# Patient Record
Sex: Female | Born: 1975
Health system: Southern US, Community
[De-identification: ages and names within clinical notes are randomized; demographics above are authoritative.]

## PROBLEM LIST (undated history)

## (undated) DIAGNOSIS — Z8759 Personal history of other complications of pregnancy, childbirth and the puerperium: Secondary | ICD-10-CM

## (undated) DIAGNOSIS — D509 Iron deficiency anemia, unspecified: Secondary | ICD-10-CM

## (undated) DIAGNOSIS — G43909 Migraine, unspecified, not intractable, without status migrainosus: Secondary | ICD-10-CM

## (undated) DIAGNOSIS — D649 Anemia, unspecified: Secondary | ICD-10-CM

## (undated) DIAGNOSIS — N83209 Unspecified ovarian cyst, unspecified side: Secondary | ICD-10-CM

## (undated) HISTORY — DX: Anemia, unspecified: D64.9

## (undated) HISTORY — DX: Migraine, unspecified, not intractable, without status migrainosus: G43.909

## (undated) HISTORY — DX: Personal history of other complications of pregnancy, childbirth and the puerperium: Z87.59

---

## 2013-04-17 ENCOUNTER — Other Ambulatory Visit (HOSPITAL_COMMUNITY): Payer: Self-pay | Admitting: Family Medicine

## 2013-04-17 DIAGNOSIS — R0989 Other specified symptoms and signs involving the circulatory and respiratory systems: Secondary | ICD-10-CM

## 2013-04-17 DIAGNOSIS — E049 Nontoxic goiter, unspecified: Secondary | ICD-10-CM

## 2013-04-17 DIAGNOSIS — H9319 Tinnitus, unspecified ear: Secondary | ICD-10-CM

## 2013-04-23 ENCOUNTER — Ambulatory Visit (HOSPITAL_COMMUNITY): Payer: 59

## 2013-04-30 ENCOUNTER — Ambulatory Visit (HOSPITAL_COMMUNITY)
Admission: RE | Admit: 2013-04-30 | Discharge: 2013-04-30 | Disposition: A | Discharge status: Home / Self Care | Payer: 59 | Source: Ambulatory Visit | Attending: Family Medicine | Admitting: Family Medicine

## 2013-04-30 DIAGNOSIS — R599 Enlarged lymph nodes, unspecified: Secondary | ICD-10-CM | POA: Insufficient documentation

## 2013-04-30 DIAGNOSIS — E049 Nontoxic goiter, unspecified: Secondary | ICD-10-CM

## 2013-05-05 ENCOUNTER — Ambulatory Visit (HOSPITAL_COMMUNITY): Payer: 59

## 2013-06-02 ENCOUNTER — Other Ambulatory Visit (HOSPITAL_COMMUNITY): Payer: 59

## 2014-10-13 IMAGING — US US SOFT TISSUE HEAD/NECK
1 series · 14 of 25 positions shown · non-contrast
Comparison: None.

CLINICAL DATA: Border

EXAM:
THYROID ULTRASOUND
TECHNIQUE: Ultrasound examination of the thyroid gland and adjacent soft
tissues was performed.

[Series 1: us soft tissue head/neck · 0.07mm/px · 14 of 26 slices shown]
[im 1/26]
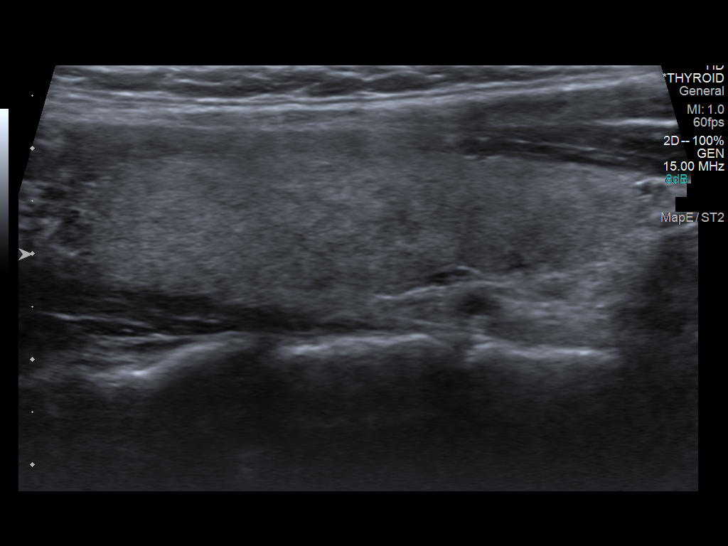
[im 3/26]
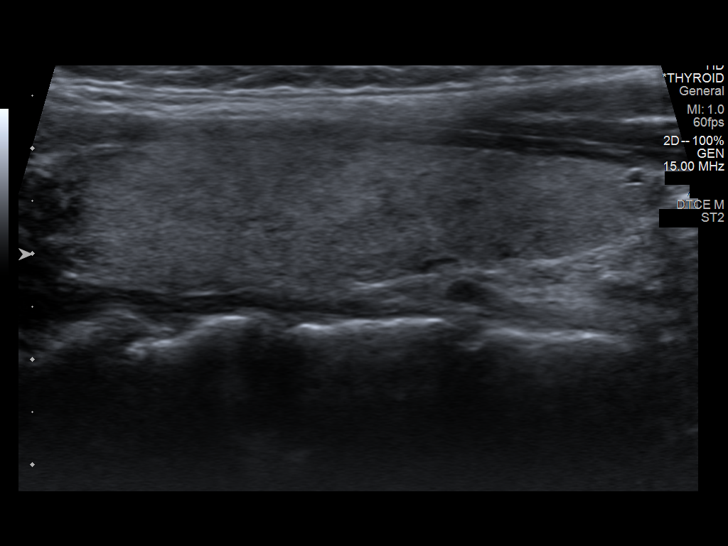
[im 5/26]
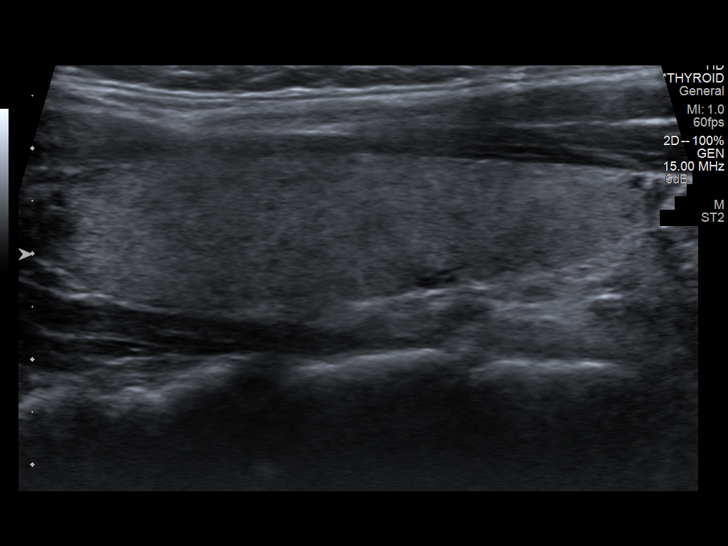
[im 7/26]
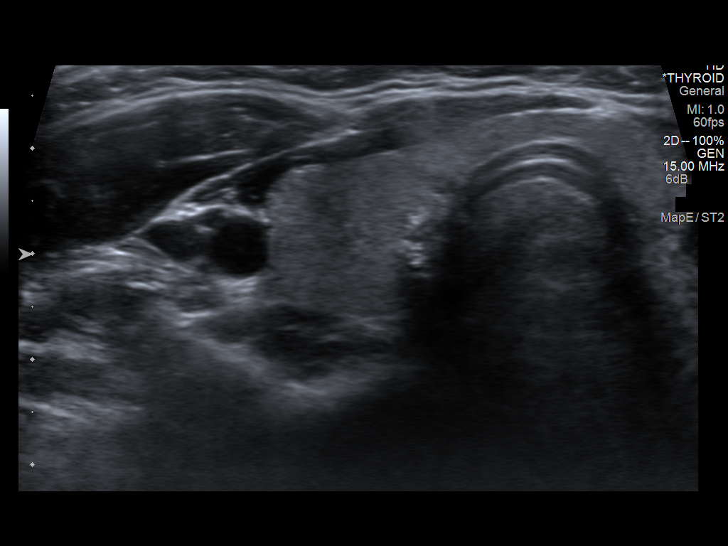
[im 9/26]
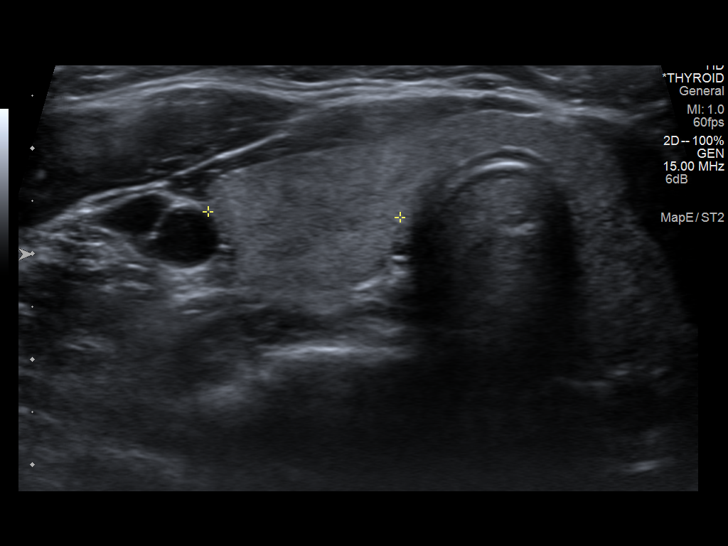
[im 10/26]
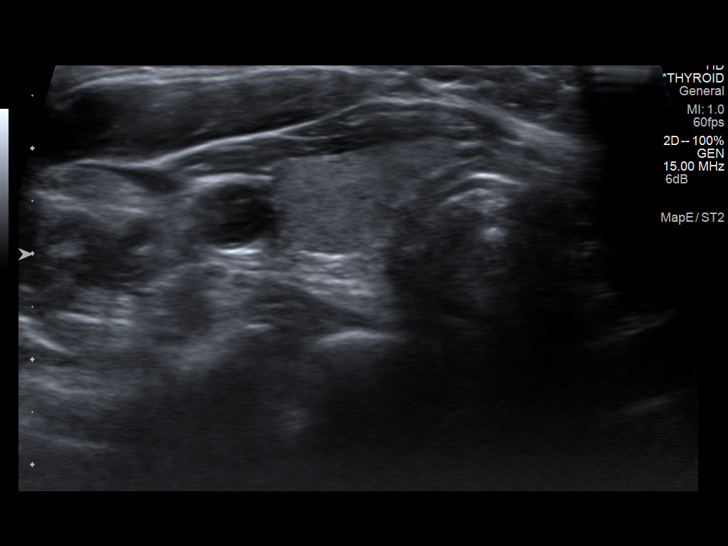
[im 12/26]
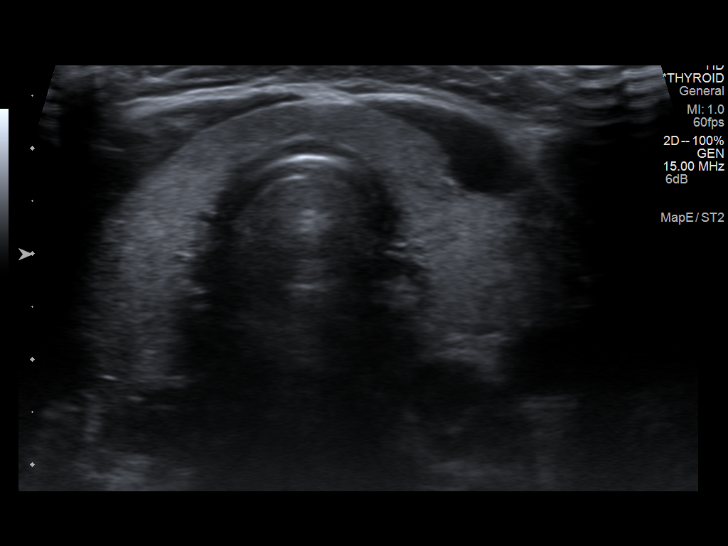
[im 14/26]
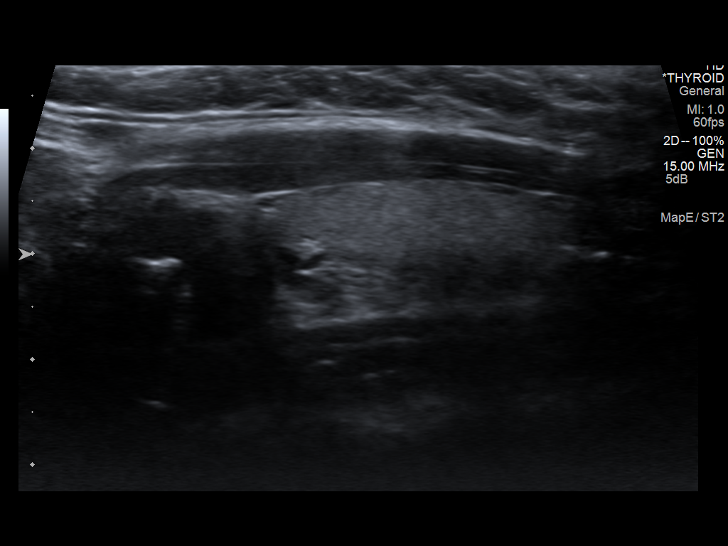
[im 16/26]
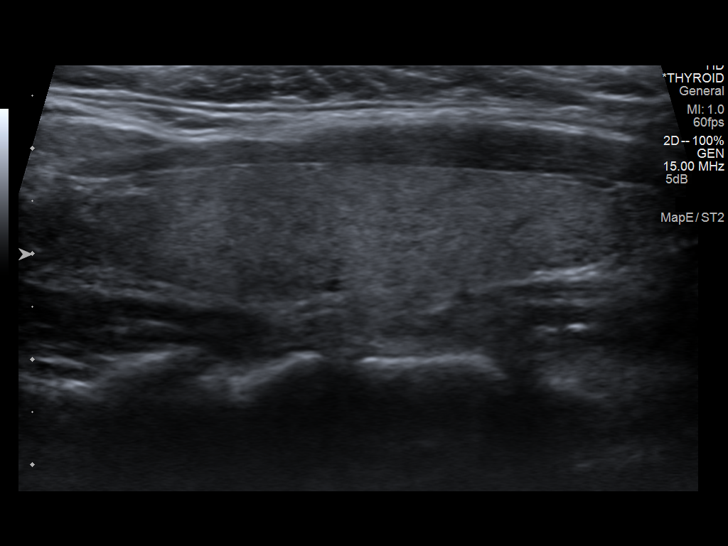
[im 17/26]
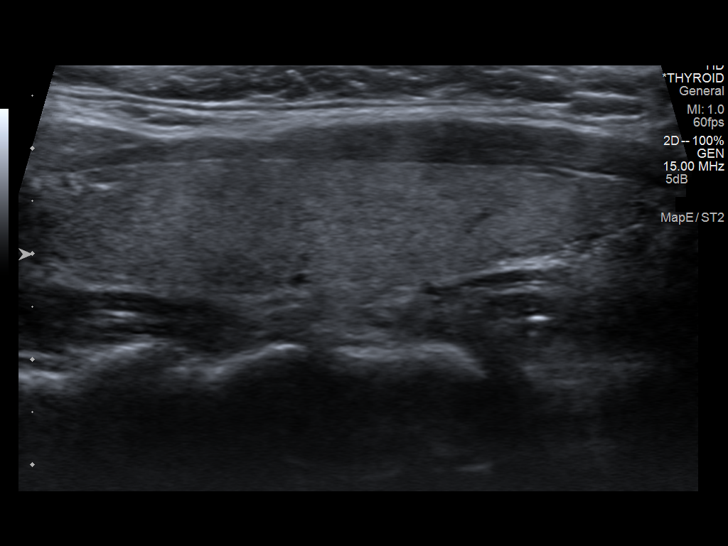
[im 19/26]
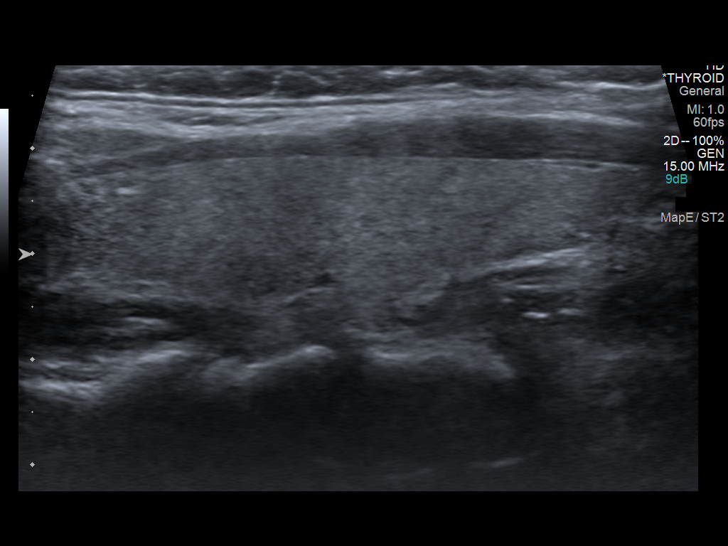
[im 21/26]
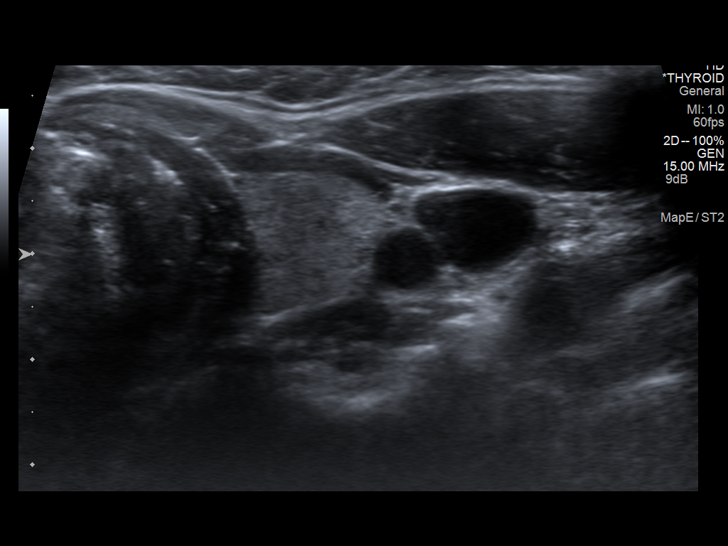
[im 23/26]
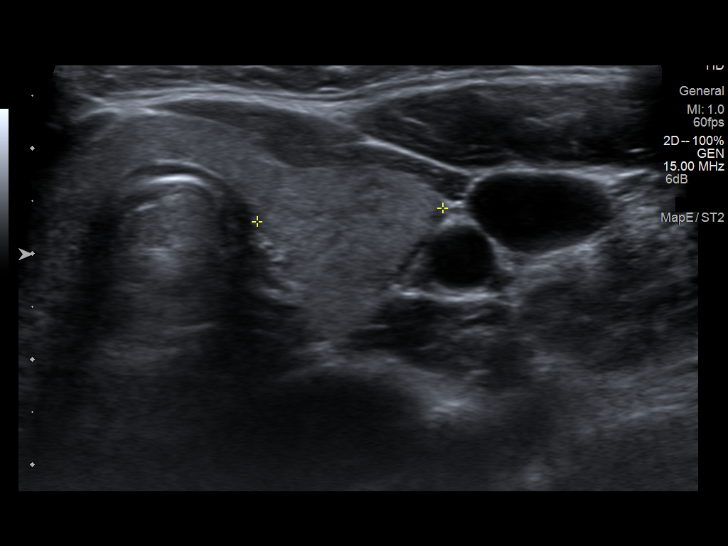
[im 26/26]
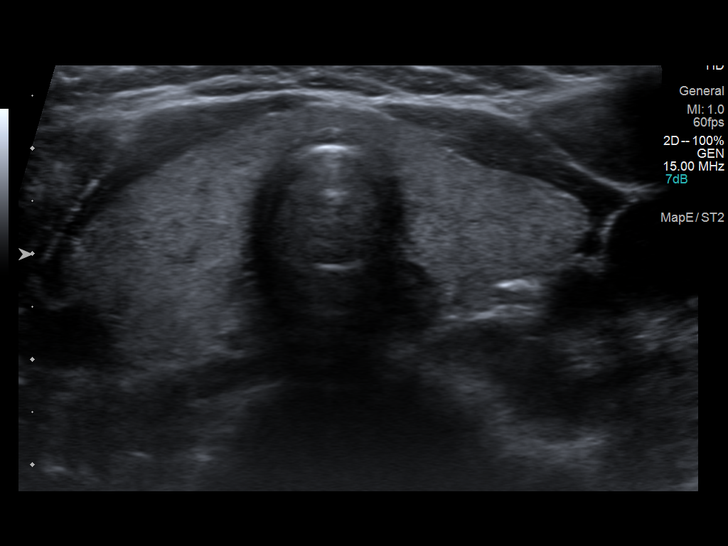

[14 of 25 positions shown; findings below may reference images not displayed]

FINDINGS: Right thyroid lobe

Measurements: 5.6 x 1.4 x 1.8 cm.  No nodules visualized.

Left thyroid lobe

Measurements: 6.0 x 1.4 x 1.8 cm from.  No nodules visualized.

Isthmus

Thickness: 0.4 cm.  No nodules visualized.

Lymphadenopathy

None visualized.
IMPRESSION: The thyroid gland is enlarged and demonstrates a fairly homogeneous
echotexture. No nodules are demonstrated.

## 2015-08-25 ENCOUNTER — Other Ambulatory Visit: Payer: Self-pay | Admitting: Pediatrics

## 2015-08-25 MED ORDER — ATOVAQUONE-PROGUANIL HCL 250-100 MG PO TABS: 1.0000 | ORAL_TABLET | Freq: Every day | ORAL | Status: DC

## 2015-08-25 MED ORDER — TYPHOID VACCINE PO CPDR: 1.0000 | DELAYED_RELEASE_CAPSULE | ORAL | Status: DC

## 2015-08-31 MED FILL — ATOVAQUONE-PROGUANIL 250-10: 250-100 | 16 days supply | Qty: 16 | Fill #0

## 2015-08-31 MED FILL — VIVOTIF EC CAPSULE: 7 days supply | Qty: 4 | Fill #0

## 2016-03-14 ENCOUNTER — Ambulatory Visit (INDEPENDENT_AMBULATORY_CARE_PROVIDER_SITE_OTHER): Payer: 59 | Admitting: Family Medicine

## 2016-03-14 VITALS — BP 114/78 | HR 70 | Temp 98.1°F | Resp 17 | Ht 65.0 in | Wt 142.0 lb

## 2016-03-14 DIAGNOSIS — R011 Cardiac murmur, unspecified: Secondary | ICD-10-CM

## 2016-03-14 DIAGNOSIS — D500 Iron deficiency anemia secondary to blood loss (chronic): Secondary | ICD-10-CM

## 2016-03-14 DIAGNOSIS — G43809 Other migraine, not intractable, without status migrainosus: Secondary | ICD-10-CM

## 2016-03-14 MED ORDER — NORETHINDRONE ACET-ETHINYL EST 1-20 MG-MCG PO TABS: 1.0000 | ORAL_TABLET | Freq: Every day | ORAL | 4 refills | Status: DC

## 2016-03-14 MED ORDER — SUMATRIPTAN SUCCINATE 100 MG PO TABS: 50.0000 mg | ORAL_TABLET | ORAL | 2 refills | Status: DC | PRN

## 2016-03-14 MED FILL — SUMATRIPTAN SUCC 100 MG TAB: 100 | 30 days supply | Qty: 9 | Fill #0

## 2016-03-14 MED FILL — LARIN 21 1-20 TABLET: 1-20 | 84 days supply | Qty: 84 | Fill #0

## 2016-03-14 NOTE — Progress Notes (Signed)
Subjective:  By signing my name below, I, Essence Howell, attest that this documentation has been prepared under the direction and in the presence of Norberto SorensonEva Shaw, MD Electronically Signed: Charline BillsEssence Howell, ED Scribe 03/14/2016 at 3:43 PM.   Patient ID: Heather Cole Moulin, female    DOB: 03-18-1976, 40 y.o.   MRN: [id number]  Chief Complaint  Patient presents with  . Contraception    and establish care    HPI HPI Comments: Heather Cole Gerst is a 40 y.o. female who presents to the [facility] to establish care and start contraception due to heavy menstrual periods. Pt has not tried any hormonal contraceptions in the past and has tried iron supplements. She states that her hemoglobin is usually around 12 but she has noticed pulsatile symptoms in her ears when her menstrual periods starts. She reports moderate cramping and heavy bleeding over the first 2 day of her menstrual period that typically last for 5 days. No prior pregnancy and no abnormal pap smears although she reports that she has not had 1 in a while. 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 No family h/o HTN, DM or high cholesterol.   Migraines H/o migraines without aura, not menstrual related that seems to be brought about with lack of sleep. She treats migraines with Imitrex but states that she ran out. Pt requests a refill at this visit.   Immunizations Pt received her flu vaccine this year and her tetanus in December 2008.   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  No past medical history on file.  No current outpatient prescriptions on file prior to visit.   No current facility-administered medications on file prior to visit.    Not on File  Review of Systems    Objective:   Physical Exam  Constitutional: She is oriented to person, place, and time. She appears well-developed and well-nourished. No distress.  HENT:  Head:  Normocephalic and atraumatic.  Eyes: Conjunctivae and EOM are normal.  Neck: Neck supple. No tracheal deviation present.  Cardiovascular: Normal rate and regular rhythm.   Murmur heard. Pulmonary/Chest: Effort normal and breath sounds normal. No respiratory distress.  Musculoskeletal: Normal range of motion.  Neurological: She is alert and oriented to person, place, and time.  Skin: Skin is warm and dry.  Psychiatric: She has a normal mood and affect. Her behavior is normal.  Nursing note and vitals reviewed.  BP 114/78 (BP Location: Right Arm, Patient Position: Sitting, Cuff Size: Normal)   Pulse 70   Temp 98.1 F (36.7 C) (Oral)   Resp 17   Ht 5\' 5"  (1.651 m)   Wt 142 lb (64.4 kg)   LMP 03/08/2016 (Approximate)   SpO2 100%   BMI 23.63 kg/m     Assessment & Plan:   1. Iron deficiency anemia due to chronic blood loss - Cont prn iron , checks hgb occ at work. Start trial of OCP to reduce menorrhagia. Check BP at work.  2. Heart murmur - physiologic, suspect benign, recheck at f/u.  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 Migraines well controlled. Pt reports due for pap and will sched, no h/o abnml.  Meds ordered this encounter  Medications  . norethindrone-ethinyl estradiol (LOESTRIN 1/20, 21,) 1-20 MG-MCG tablet    Sig: Take 1 tablet by mouth daily.    Dispense:  4 Package    Refill:  4  Recommend skipping placebo pills so will need 4 packs every 3 mos  . SUMAtriptan (IMITREX) 100 MG tablet    Sig: Take 0.5 tablets (50 mg total) by mouth every 2 (two) hours as needed for migraine. May repeat in 2 hours if headache persists or recurs.    Dispense:  10 tablet    Refill:  2    I personally performed the services described in this documentation, which was scribed in my presence. The recorded information has been reviewed and considered, and addended by me as needed.   Norberto SorensonEva Shaw, M.D.  [facility]  [place] [address] [place], [place]  [address] [phone] phone [phone] fax  03/27/16 10:33 PM

## 2016-03-14 NOTE — Patient Instructions (Addendum)
     IF you received an x-ray today, you will receive an invoice from [facility]. Please contact [facility] at [phone] with questions or concerns regarding your invoice.   IF you received labwork today, you will receive an invoice from [facility]. Please contact [facility] at [phone] with questions or concerns regarding your invoice.   Our billing staff will not be able to assist you with questions regarding bills from these companies.  You will be contacted with the lab results as soon as they are available. The fastest way to get your results is to activate your My Chart account. Instructions are located on the last page of this paperwork. If you have not heard from us regarding the results in 2 weeks, please contact this office.     

## 2016-03-27 ENCOUNTER — Telehealth: Payer: 59 | Admitting: Nurse Practitioner

## 2016-03-27 DIAGNOSIS — N3000 Acute cystitis without hematuria: Secondary | ICD-10-CM

## 2016-03-27 MED ORDER — NITROFURANTOIN MONOHYD MACRO 100 MG PO CAPS: 100.0000 mg | ORAL_CAPSULE | Freq: Two times a day (BID) | ORAL | 0 refills | Status: DC

## 2016-03-27 NOTE — Progress Notes (Signed)

## 2016-03-30 ENCOUNTER — Encounter: Payer: Self-pay | Admitting: Family Medicine

## 2016-04-09 DIAGNOSIS — D500 Iron deficiency anemia secondary to blood loss (chronic): Secondary | ICD-10-CM | POA: Insufficient documentation

## 2016-04-09 DIAGNOSIS — N920 Excessive and frequent menstruation with regular cycle: Secondary | ICD-10-CM | POA: Insufficient documentation

## 2016-04-09 DIAGNOSIS — G43009 Migraine without aura, not intractable, without status migrainosus: Secondary | ICD-10-CM | POA: Insufficient documentation

## 2016-04-09 DIAGNOSIS — Z91030 Bee allergy status: Secondary | ICD-10-CM | POA: Insufficient documentation

## 2016-04-09 NOTE — Progress Notes (Signed)
Subjective:    Patient ID: Heather Cole, female    DOB: 16-Feb-1976, 41 y.o.   MRN: [id number] Chief Complaint  Patient presents with  . Annual Exam    pap    HPI  Heather Cole is a delightful 41 yo woman here today for a complete physical.   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  Primary Preventative Screenings: Cervical Cancer: No h/o abnml paps, last has been a while. STI screening: Breast Cancer: Colorectal Cancer: Lung Cancer: Bone Density: Cardiac: Weight/blood sugar: OTC/vit/supp/herbal: Diet/Exercise: Dentist/Optho: Immunizations: tetanus 02/2007, flu annually at work  Chronic Medical Conditions: Migraines: prn imitrex   Menorrhagia: monitors hgb occ and uses iron supp prn. Can tell when anemic by tinnitus type pulse sound. Started pt on OCPs 1 mo prior. Placed on lowest dose of LoEstrin but has had breakthrough bleeding. hgb 12 last week so added more meat in her diet last week - takes iron prn 4-5 days/wk.  Anaphylaxis to bees - keeps epi-pen.   Depression screen PHQ 2/9 04/10/2016  Decreased Interest 0  Down, Depressed, Hopeless 0  PHQ - 2 Score 0   Past Medical History:  Diagnosis Date  . Anemia    No past surgical history on file. Current Outpatient Prescriptions on File Prior to Visit  Medication Sig Dispense Refill  . SUMAtriptan (IMITREX) 100 MG tablet Take 0.5 tablets (50 mg total) by mouth every 2 (two) hours as needed for migraine. May repeat in 2 hours if headache persists or recurs. 10 tablet 2  . nitrofurantoin, macrocrystal-monohydrate, (MACROBID) 100 MG capsule Take 1 capsule (100 mg total) by mouth 2 (two) times daily. 1 po BId (Patient not taking: Reported on 04/10/2016) 14 capsule 0   No current facility-administered medications on file prior to visit.    Not on File No family history on file. Social History   Social History  . Marital status: Single    Spouse name: N/A  . Number of children: N/A  . Years of  education: N/A   Social History Main Topics  . Smoking status: Never Smoker  . Smokeless tobacco: None  . Alcohol use None  . Drug use: Unknown  . Sexual activity: Not Asked   Other Topics Concern  . None   Social History Narrative  . None    Review of Systems See hpi    Objective:   Physical Exam  Constitutional: She is oriented to person, place, and time. She appears well-developed and well-nourished. No distress.  HENT:  Head: Normocephalic and atraumatic.  Right Ear: Tympanic membrane, external ear and ear canal normal.  Left Ear: Tympanic membrane, external ear and ear canal normal.  Nose: Mucosal edema present. No rhinorrhea.  Mouth/Throat: Uvula is midline, oropharynx is clear and moist and mucous membranes are normal. No posterior oropharyngeal erythema.  Eyes: Conjunctivae and EOM are normal. Pupils are equal, round, and reactive to light. Right eye exhibits no discharge. Left eye exhibits no discharge. No scleral icterus.  Neck: Normal range of motion. Neck supple. No thyromegaly present.  Cardiovascular: Normal rate, regular rhythm, normal heart sounds and intact distal pulses.   Pulmonary/Chest: Effort normal and breath sounds normal. No respiratory distress.  Abdominal: Soft. Bowel sounds are normal. There is no tenderness.  Genitourinary: Vagina normal and uterus normal. No breast swelling, tenderness, discharge or bleeding. Cervix exhibits no motion tenderness and no friability. Right adnexum displays no mass and no tenderness. Left adnexum displays no mass and no tenderness.  Musculoskeletal:  She exhibits no edema.  Lymphadenopathy:    She has no cervical adenopathy.  Neurological: She is alert and oriented to person, place, and time. She has normal reflexes.  Skin: Skin is warm and dry. She is not diaphoretic. No erythema.  Psychiatric: She has a normal mood and affect. Her behavior is normal.     BP 126/79   Pulse 61   Temp 97.9 F (36.6 C) (Oral)   Resp  16   Ht 5\' 5"  (1.651 m)   Wt 145 lb (65.8 kg)   LMP 03/02/2016   SpO2 100%   BMI 24.13 kg/m   Assessment & Plan:  Ok to provide another years refill of OCPs if pt desires at the end of the year as long as she is continueing to monitor her blood pressure and hemoglobin at her office. 1. Annual physical exam   2. Routine screening for STI (sexually transmitted infection)   3. Screening for cardiovascular, respiratory, and genitourinary diseases   4. Screening for cervical cancer   5. Screening for thyroid disorder   6. Menorrhagia with regular cycle   7. Iron deficiency anemia due to chronic blood loss   8. Migraine without aura and without status migrainosus, not intractable   9. History of systemic reaction to bee sting   10. Macrocytosis   Step dose of OCPs up as she has had breakthrough bleeding  Orders Placed This Encounter  Procedures  . Tdap vaccine greater than or equal to 7yo IM  . CBC  . Comprehensive metabolic panel    Order Specific Question:   Has the patient fasted?    Answer:   Yes  . TSH  . Lipid panel    Order Specific Question:   Has the patient fasted?    Answer:   Yes  . HIV antibody  . Ferritin  . B12 and Folate Panel  . B12 and Folate Panel  . Specimen status report  . POCT urinalysis dipstick    Meds ordered this encounter  Medications  . norethindrone-ethinyl estradiol 1/35 (ORTHO-NOVUM 1/35, 28,) tablet    Sig: Take 1 tablet by mouth daily. Skipping placebo pills    Dispense:  4 Package    Refill:  4    Recommend skipping placebo pills so will need 4 packs every 3 months     Norberto SorensonEva Shaw, M.D.  [facility]  [place] [address] [place], [place] [address] [phone] phone [phone] fax  05/07/16 1:14 AM

## 2016-04-10 ENCOUNTER — Ambulatory Visit (INDEPENDENT_AMBULATORY_CARE_PROVIDER_SITE_OTHER): Payer: 59 | Admitting: Family Medicine

## 2016-04-10 ENCOUNTER — Encounter: Payer: Self-pay | Admitting: Family Medicine

## 2016-04-10 VITALS — BP 126/79 | HR 61 | Temp 97.9°F | Resp 16 | Ht 65.0 in | Wt 145.0 lb

## 2016-04-10 DIAGNOSIS — Z113 Encounter for screening for infections with a predominantly sexual mode of transmission: Secondary | ICD-10-CM | POA: Diagnosis not present

## 2016-04-10 DIAGNOSIS — Z1383 Encounter for screening for respiratory disorder NEC: Secondary | ICD-10-CM

## 2016-04-10 DIAGNOSIS — Z1329 Encounter for screening for other suspected endocrine disorder: Secondary | ICD-10-CM | POA: Diagnosis not present

## 2016-04-10 DIAGNOSIS — D7589 Other specified diseases of blood and blood-forming organs: Secondary | ICD-10-CM | POA: Diagnosis not present

## 2016-04-10 DIAGNOSIS — Z23 Encounter for immunization: Secondary | ICD-10-CM | POA: Diagnosis not present

## 2016-04-10 DIAGNOSIS — Z1389 Encounter for screening for other disorder: Secondary | ICD-10-CM

## 2016-04-10 DIAGNOSIS — Z136 Encounter for screening for cardiovascular disorders: Secondary | ICD-10-CM

## 2016-04-10 DIAGNOSIS — Z124 Encounter for screening for malignant neoplasm of cervix: Secondary | ICD-10-CM | POA: Diagnosis not present

## 2016-04-10 DIAGNOSIS — G43009 Migraine without aura, not intractable, without status migrainosus: Secondary | ICD-10-CM | POA: Diagnosis not present

## 2016-04-10 DIAGNOSIS — Z91030 Bee allergy status: Secondary | ICD-10-CM | POA: Diagnosis not present

## 2016-04-10 DIAGNOSIS — N920 Excessive and frequent menstruation with regular cycle: Secondary | ICD-10-CM | POA: Diagnosis not present

## 2016-04-10 DIAGNOSIS — Z0000 Encounter for general adult medical examination without abnormal findings: Secondary | ICD-10-CM

## 2016-04-10 DIAGNOSIS — D500 Iron deficiency anemia secondary to blood loss (chronic): Secondary | ICD-10-CM | POA: Diagnosis not present

## 2016-04-10 LAB — POCT URINALYSIS DIP (MANUAL ENTRY)
Bilirubin, UA: NEGATIVE
Blood, UA: NEGATIVE
Glucose, UA: NEGATIVE
Ketones, POC UA: NEGATIVE
Leukocytes, UA: NEGATIVE
Nitrite, UA: NEGATIVE
Protein Ur, POC: NEGATIVE
Spec Grav, UA: 1.01
Urobilinogen, UA: 0.2
pH, UA: 7

## 2016-04-10 MED ORDER — NORETHINDRONE-ETH ESTRADIOL 1-35 MG-MCG PO TABS: 1.0000 | ORAL_TABLET | Freq: Every day | ORAL | 4 refills | Status: DC

## 2016-04-10 MED FILL — DASETTA 1-35-28 TABLET: 1-35 | 84 days supply | Qty: 112 | Fill #0

## 2016-04-10 NOTE — Patient Instructions (Addendum)
If you ever need anything and I am not being good about MyChart response, never hesitate to text me at [phone].    IF you received an x-ray today, you will receive an invoice from [facility]. Please contact [facility] at [phone] with questions or concerns regarding your invoice.   IF you received labwork today, you will receive an invoice from [facility]. Please contact [facility] at [phone] with questions or concerns regarding your invoice.   Our billing staff will not be able to assist you with questions regarding bills from these companies.  You will be contacted with the lab results as soon as they are available. The fastest way to get your results is to activate your My Chart account. Instructions are located on the last page of this paperwork. If you have not heard from [place] regarding the results in 2 weeks, please contact this office.     Health Maintenance, Female Introduction Adopting a healthy lifestyle and getting preventive care can go a long way to promote health and wellness. Talk with your health care provider about what schedule of regular examinations is right for you. This is a good chance for you to check in with your provider about disease prevention and staying healthy. In between checkups, there are plenty of things you can do on your own. Experts have done a lot of research about which lifestyle changes and preventive measures are most likely to keep you healthy. Ask your health care provider for more information. Weight and diet Eat a healthy diet  Be sure to include plenty of vegetables, fruits, low-fat dairy products, and lean protein.  Do not eat a lot of foods high in solid fats, added sugars, or salt.  Get regular exercise. This is one of the most important things you can do for your health.  Most adults should exercise for at least 150 minutes each week. The exercise should increase your heart rate and make you sweat  (moderate-intensity exercise).  Most adults should also do strengthening exercises at least twice a week. This is in addition to the moderate-intensity exercise. Maintain a healthy weight  Body mass index (BMI) is a measurement that can be used to identify possible weight problems. It estimates body fat based on height and weight. Your health care provider can help determine your BMI and help you achieve or maintain a healthy weight.  For females 69 years of age and older:  A BMI below 18.5 is considered underweight.  A BMI of 18.5 to 24.9 is normal.  A BMI of 25 to 29.9 is considered overweight.  A BMI of 30 and above is considered obese. Watch levels of cholesterol and blood lipids  You should start having your blood tested for lipids and cholesterol at 39 years of age, then have this test every 5 years.  You may need to have your cholesterol levels checked more often if:  Your lipid or cholesterol levels are high.  You are older than 39 years of age.  You are at high risk for heart disease. Cancer screening Lung Cancer  Lung cancer screening is recommended for adults 63-81 years old who are at high risk for lung cancer because of a history of smoking.  A yearly low-dose CT scan of the lungs is recommended for people who:  Currently smoke.  Have quit within the past 15 years.  Have at least a 30-pack-year history of smoking. A pack year is smoking an average of one pack of cigarettes a day  for 1 year.  Yearly screening should continue until it has been 15 years since you quit.  Yearly screening should stop if you develop a health problem that would prevent you from having lung cancer treatment. Breast Cancer  Practice breast self-awareness. This means understanding how your breasts normally appear and feel.  It also means doing regular breast self-exams. Let your health care provider know about any changes, no matter how small.  If you are in your 20s or 30s, you  should have a clinical breast exam (CBE) by a health care provider every 1-3 years as part of a regular health exam.  If you are 22 or older, have a CBE every year. Also consider having a breast X-ray (mammogram) every year.  If you have a family history of breast cancer, talk to your health care provider about genetic screening.  If you are at high risk for breast cancer, talk to your health care provider about having an MRI and a mammogram every year.  Breast cancer gene (BRCA) assessment is recommended for women who have family members with BRCA-related cancers. BRCA-related cancers include:  Breast.  Ovarian.  Tubal.  Peritoneal cancers.  Results of the assessment will determine the need for genetic counseling and BRCA1 and BRCA2 testing. Cervical Cancer  Your health care provider may recommend that you be screened regularly for cancer of the pelvic organs (ovaries, uterus, and vagina). This screening involves a pelvic examination, including checking for microscopic changes to the surface of your cervix (Pap test). You may be encouraged to have this screening done every 3 years, beginning at age 33.  For women ages 84-65, health care providers may recommend pelvic exams and Pap testing every 3 years, or they may recommend the Pap and pelvic exam, combined with testing for human papilloma virus (HPV), every 5 years. Some types of HPV increase your risk of cervical cancer. Testing for HPV may also be done on women of any age with unclear Pap test results.  Other health care providers may not recommend any screening for nonpregnant women who are considered low risk for pelvic cancer and who do not have symptoms. Ask your health care provider if a screening pelvic exam is right for you.  If you have had past treatment for cervical cancer or a condition that could lead to cancer, you need Pap tests and screening for cancer for at least 20 years after your treatment. If Pap tests have been  discontinued, your risk factors (such as having a new sexual partner) need to be reassessed to determine if screening should resume. Some women have medical problems that increase the chance of getting cervical cancer. In these cases, your health care provider may recommend more frequent screening and Pap tests. Colorectal Cancer  This type of cancer can be detected and often prevented.  Routine colorectal cancer screening usually begins at 39 years of age and continues through 39 years of age.  Your health care provider may recommend screening at an earlier age if you have risk factors for colon cancer.  Your health care provider may also recommend using home test kits to check for hidden blood in the stool.  A small camera at the end of a tube can be used to examine your colon directly (sigmoidoscopy or colonoscopy). This is done to check for the earliest forms of colorectal cancer.  Routine screening usually begins at age 29.  Direct examination of the colon should be repeated every 5-10 years through 39 years  of age. However, you may need to be screened more often if early forms of precancerous polyps or small growths are found. Skin Cancer  Check your skin from head to toe regularly.  Tell your health care provider about any new moles or changes in moles, especially if there is a change in a mole's shape or color.  Also tell your health care provider if you have a mole that is larger than the size of a pencil eraser.  Always use sunscreen. Apply sunscreen liberally and repeatedly throughout the day.  Protect yourself by wearing long sleeves, pants, a wide-brimmed hat, and sunglasses whenever you are outside. Heart disease, diabetes, and high blood pressure  High blood pressure causes heart disease and increases the risk of stroke. High blood pressure is more likely to develop in:  People who have blood pressure in the high end of the normal range (130-139/85-89 mm Hg).  People  who are overweight or obese.  People who are African American.  If you are 62-43 years of age, have your blood pressure checked every 3-5 years. If you are 84 years of age or older, have your blood pressure checked every year. You should have your blood pressure measured twice-once when you are at a hospital or clinic, and once when you are not at a hospital or clinic. Record the average of the two measurements. To check your blood pressure when you are not at a hospital or clinic, you can use:  An automated blood pressure machine at a pharmacy.  A home blood pressure monitor.  If you are between 42 years and 95 years old, ask your health care provider if you should take aspirin to prevent strokes.  Have regular diabetes screenings. This involves taking a blood sample to check your fasting blood sugar level.  If you are at a normal weight and have a low risk for diabetes, have this test once every three years after 39 years of age.  If you are overweight and have a high risk for diabetes, consider being tested at a younger age or more often. Preventing infection Hepatitis B  If you have a higher risk for hepatitis B, you should be screened for this virus. You are considered at high risk for hepatitis B if:  You were born in a country where hepatitis B is common. Ask your health care provider which countries are considered high risk.  Your parents were born in a high-risk country, and you have not been immunized against hepatitis B (hepatitis B vaccine).  You have HIV or AIDS.  You use needles to inject street drugs.  You live with someone who has hepatitis B.  You have had sex with someone who has hepatitis B.  You get hemodialysis treatment.  You take certain medicines for conditions, including cancer, organ transplantation, and autoimmune conditions. Hepatitis C  Blood testing is recommended for:  Everyone born from [birth year] through 1963.  Anyone with known risk factors for  hepatitis C. Sexually transmitted infections (STIs)  You should be screened for sexually transmitted infections (STIs) including gonorrhea and chlamydia if:  You are sexually active and are younger than 39 years of age.  You are older than 39 years of age and your health care provider tells you that you are at risk for this type of infection.  Your sexual activity has changed since you were last screened and you are at an increased risk for chlamydia or gonorrhea. Ask your health care provider if you are  at risk.  If you do not have HIV, but are at risk, it may be recommended that you take a prescription medicine daily to prevent HIV infection. This is called pre-exposure prophylaxis (PrEP). You are considered at risk if:  You are sexually active and do not regularly use condoms or know the HIV status of your partner(s).  You take drugs by injection.  You are sexually active with a partner who has HIV. Talk with your health care provider about whether you are at high risk of being infected with HIV. If you choose to begin PrEP, you should first be tested for HIV. You should then be tested every 3 months for as long as you are taking PrEP. Pregnancy  If you are premenopausal and you may become pregnant, ask your health care provider about preconception counseling.  If you may become pregnant, take 400 to 800 micrograms (mcg) of folic acid every day.  If you want to prevent pregnancy, talk to your health care provider about birth control (contraception). Osteoporosis and menopause  Osteoporosis is a disease in which the bones lose minerals and strength with aging. This can result in serious bone fractures. Your risk for osteoporosis can be identified using a bone density scan.  If you are 91 years of age or older, or if you are at risk for osteoporosis and fractures, ask your health care provider if you should be screened.  Ask your health care provider whether you should take a calcium  or vitamin D supplement to lower your risk for osteoporosis.  Menopause may have certain physical symptoms and risks.  Hormone replacement therapy may reduce some of these symptoms and risks. Talk to your health care provider about whether hormone replacement therapy is right for you. Follow these instructions at home:  Schedule regular health, dental, and eye exams.  Stay current with your immunizations.  Do not use any tobacco products including cigarettes, chewing tobacco, or electronic cigarettes.  If you are pregnant, do not drink alcohol.  If you are breastfeeding, limit how much and how often you drink alcohol.  Limit alcohol intake to no more than 1 drink per day for nonpregnant women. One drink equals 12 ounces of beer, 5 ounces of wine, or 1 ounces of hard liquor.  Do not use street drugs.  Do not share needles.  Ask your health care provider for help if you need support or information about quitting drugs.  Tell your health care provider if you often feel depressed.  Tell your health care provider if you have ever been abused or do not feel safe at home. This information is not intended to replace advice given to you by your health care provider. Make sure you discuss any questions you have with your health care provider. Document Released: 09/26/2010 Document Revised: 08/19/2015 Document Reviewed: 12/15/2014  2017 Elsevier

## 2016-04-11 LAB — CBC
Hematocrit: 38.5 % (ref 34.0–46.6)
Hemoglobin: 12.6 g/dL (ref 11.1–15.9)
MCH: 32.8 pg (ref 26.6–33.0)
MCHC: 32.7 g/dL (ref 31.5–35.7)
MCV: 100 fL — ABNORMAL HIGH (ref 79–97)
PLATELETS: 383 10*3/uL — AB (ref 150–379)
RBC: 3.84 x10E6/uL (ref 3.77–5.28)
RDW: 13.7 % (ref 12.3–15.4)
WBC: 9.5 10*3/uL (ref 3.4–10.8)

## 2016-04-11 LAB — FERRITIN: FERRITIN: 33 ng/mL (ref 15–150)

## 2016-04-11 LAB — LIPID PANEL
CHOLESTEROL TOTAL: 235 mg/dL — AB (ref 100–199)
Chol/HDL Ratio: 2.8 ratio units (ref 0.0–4.4)
HDL: 84 mg/dL (ref >39)
LDL Calculated: 119 mg/dL — ABNORMAL HIGH (ref 0–99)
Triglycerides: 159 mg/dL — ABNORMAL HIGH (ref 0–149)
VLDL CHOLESTEROL CAL: 32 mg/dL (ref 5–40)

## 2016-04-11 LAB — COMPREHENSIVE METABOLIC PANEL
ALK PHOS: 43 IU/L (ref 39–117)
ALT: 18 IU/L (ref 0–32)
AST: 20 IU/L (ref 0–40)
Albumin/Globulin Ratio: 1.6 (ref 1.2–2.2)
Albumin: 4.5 g/dL (ref 3.5–5.5)
BUN/Creatinine Ratio: 11 (ref 9–23)
BUN: 9 mg/dL (ref 6–20)
Bilirubin Total: 0.7 mg/dL (ref 0.0–1.2)
CO2: 23 mmol/L (ref 18–29)
CREATININE: 0.81 mg/dL (ref 0.57–1.00)
Calcium: 9.8 mg/dL (ref 8.7–10.2)
Chloride: 99 mmol/L (ref 96–106)
GFR calc Af Amer: 107 mL/min/{1.73_m2} (ref >59)
GFR calc non Af Amer: 92 mL/min/{1.73_m2} (ref >59)
GLUCOSE: 96 mg/dL (ref 65–99)
Globulin, Total: 2.9 g/dL (ref 1.5–4.5)
Potassium: 4.3 mmol/L (ref 3.5–5.2)
SODIUM: 141 mmol/L (ref 134–144)
Total Protein: 7.4 g/dL (ref 6.0–8.5)

## 2016-04-11 LAB — TSH: TSH: 0.751 u[IU]/mL (ref 0.450–4.500)

## 2016-04-11 LAB — HIV ANTIBODY (ROUTINE TESTING W REFLEX): HIV Screen 4th Generation wRfx: NONREACTIVE

## 2016-04-12 ENCOUNTER — Telehealth: Payer: 59 | Admitting: Nurse Practitioner

## 2016-04-12 DIAGNOSIS — N3000 Acute cystitis without hematuria: Secondary | ICD-10-CM | POA: Diagnosis not present

## 2016-04-12 LAB — PAP IG, CT-NG NAA, HPV HIGH-RISK
Chlamydia, Nuc. Acid Amp: NEGATIVE
Gonococcus by Nucleic Acid Amp: NEGATIVE
HPV, high-risk: NEGATIVE
PAP Smear Comment: 0

## 2016-04-12 MED ORDER — SULFAMETHOXAZOLE-TRIMETHOPRIM 800-160 MG PO TABS: 1.0000 | ORAL_TABLET | Freq: Two times a day (BID) | ORAL | 0 refills | Status: DC

## 2016-04-12 NOTE — Progress Notes (Signed)
We are sorry that you are not feeling well.  Here is how we plan to help!  Based on what you shared with me it looks like you most likely have a simple urinary tract infection.  A UTI (Urinary Tract Infection) is a bacterial infection of the bladder.  Most cases of urinary tract infections are simple to treat but a key part of your care is to encourage you to drink plenty of fluids and watch your symptoms carefully.  I have prescribed Bactrim Ds 1 po Bid X7 days.  Your symptoms should gradually improve. Call us if the burning in your urine worsens, you develop worsening fever, back pain or pelvic pain or if your symptoms do not resolve after completing the antibiotic.  Urinary tract infections can be prevented by drinking plenty of water to keep your body hydrated.  Also be sure when you wipe, wipe from front to back and don't hold it in!  If possible, empty your bladder every 4 hours.  Your e-visit answers were reviewed by a board certified advanced clinical practitioner to complete your personal care plan.  Depending on the condition, your plan could have included both over the counter or prescription medications.  If there is a problem please reply  once you have received a response from your provider.  Your safety is important to us.  If you have drug allergies check your prescription carefully.    You can use MyChart to ask questions about today's visit, request a non-urgent call back, or ask for a work or school excuse for 24 hours related to this e-Visit. If it has been greater than 24 hours you will need to follow up with your provider, or enter a new e-Visit to address those concerns.   You will get an e-mail in the next two days asking about your experience.  I hope that your e-visit has been valuable and will speed your recovery. Thank you for using e-visits.

## 2016-04-14 MED ORDER — SULFAMETHOXAZOLE-TRIMETHOPRIM 800-160 MG PO TABS: 1.0000 | ORAL_TABLET | Freq: Two times a day (BID) | ORAL | 0 refills | Status: DC

## 2016-04-14 MED FILL — SULFAMETHOXAZOLE/TMP DS TAB: 800-160 | 7 days supply | Qty: 14 | Fill #0

## 2016-04-14 NOTE — Addendum Note (Signed)
Addended by: Constance HawWITHROW, Agape Hardiman C on: 04/14/2016 11:03 AM   Modules accepted: Orders

## 2016-04-17 LAB — B12 AND FOLATE PANEL
Folate: 7.9 ng/mL (ref >3.0)
VITAMIN B 12: 366 pg/mL (ref 232–1245)

## 2016-04-17 LAB — SPECIMEN STATUS REPORT

## 2016-05-03 ENCOUNTER — Telehealth: Payer: 59 | Admitting: Nurse Practitioner

## 2016-05-03 DIAGNOSIS — N3000 Acute cystitis without hematuria: Secondary | ICD-10-CM | POA: Diagnosis not present

## 2016-05-03 MED ORDER — NITROFURANTOIN MONOHYD MACRO 100 MG PO CAPS: 100.0000 mg | ORAL_CAPSULE | Freq: Two times a day (BID) | ORAL | 0 refills | Status: DC

## 2016-05-03 MED FILL — NITROFURANTOIN MONO-MCR 100: 100 | 7 days supply | Qty: 14 | Fill #0

## 2016-05-03 NOTE — Progress Notes (Signed)

## 2016-05-07 NOTE — Progress Notes (Signed)
Subjective:    Patient ID: Heather Cole, female    DOB: [birth year], 41 y.o.   MRN: [id number] Chief Complaint  Patient presents with  . 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  Declines the need for STD testing.  No vag d/c, dyspareunia, pelvic/abd pain.  Has had prob with UTIs with sexual activity prior. No prob with bowels. Each time she develops sxs, she will run her own UA-dip.  With the first UTI she had approx 1+ blood and 1+ leuks but neg nitrates and she self-treated with a course of macrobid 100mg  bid x 7d that she had on hand.  Then I saw her approximately 1 mo prior for her CPE and pap with normal UA and almost immed after she developed dysuria with pain around the external urethra, frequency, and urine was cloudy and odiferous. Self UA-dip showed + leuks and so she self treated with some herbal products such as usnea and uva ursi (increase kidney and urinary system function and excretion - can help detox the GU system)  Earlier this week she again had symptoms with UA dip being positive not only for blood and leuks but also for nitrites this time. During a prior [facility] E-visit for UTIs she had been prescribed Bactrim DM bid x 7d that she had not needed so she started that but after 3d the nausea worsened with abdominal upset and she started itching so she stopped the Bactrim and another [facility] E-visit resulted in macrobid 100mg  bid x 5d which she is still taking (began about 3d prior).  Her symptoms have all resolved but did note that they took longer to resolve when using macrobid prior compared to other antibiotics.  She would like to talk about ways to prophylax for these recurrent UTIs now that she is planning to stay sexually active in this new relationship. Does not want to daily  prophylaxis. She is happy to continue dipping her own urine and doing the e-visits when it is positive but she is concerned that at some point the e-visit provider will want her to be seen due to her recent frequency.  Was wondering about self-treatment with a + U dip and consistent sxs.  [facility] 05/08/2016 04/10/2016  Decreased Interest 0 0  Down, Depressed, Hopeless 0 0  PHQ - 2 Score 0 0   Past Medical History:  Diagnosis Date  . Anemia    No past surgical history on file. Current Outpatient Prescriptions on File Prior to Visit  Medication Sig Dispense Refill  . nitrofurantoin, macrocrystal-monohydrate, (MACROBID) 100 MG capsule Take 1 capsule (100 mg total) by mouth 2 (two) times daily. 1 po BId 14 capsule 0  . norethindrone-ethinyl estradiol 1/35 (ORTHO-NOVUM 1/35, 28,) tablet Take 1 tablet by mouth daily. Skipping placebo pills 4 Package 4  . SUMAtriptan (IMITREX) 100 MG tablet Take 0.5 tablets (50 mg total) by mouth every 2 (two) hours as needed for migraine. May repeat in 2 hours if headache persists or recurs. 10 tablet 2   No current facility-administered medications on file prior to visit.    Not on File No family history on file. Social History   Social History  . Marital status: Single    Spouse name: N/A  . Number of children:  N/A  . Years of education: N/A   Social History Main Topics  . Smoking status: Never Smoker  . Smokeless tobacco: Never Used  . Alcohol use None  . Drug use: Unknown  . Sexual activity: Not Asked   Other Topics Concern  . None   Social History Narrative  . None   Depression screen [facility] 2/9 05/08/2016 04/10/2016  Decreased Interest 0 0  Down, Depressed, Hopeless 0 0  PHQ - 2 Score 0 0    Review of Systems  Constitutional: Negative for activity change, appetite change, chills, diaphoresis, fatigue, fever and unexpected weight change.  Gastrointestinal: Negative for abdominal pain, anal bleeding, blood in stool,  constipation, diarrhea and rectal pain.  Genitourinary: Negative for decreased urine volume, difficulty urinating, dyspareunia, dysuria, enuresis, flank pain, frequency, genital sores, hematuria, menstrual problem, pelvic pain, urgency, vaginal bleeding, vaginal discharge and vaginal pain.  Musculoskeletal: Negative for gait problem.  Skin: Negative for rash.  Hematological: Negative for adenopathy.  Psychiatric/Behavioral: The patient is not nervous/anxious.        Objective:   Physical Exam  Constitutional: She is oriented to person, place, and time. She appears well-developed and well-nourished. No distress.  HENT:  Head: Normocephalic and atraumatic.  Right Ear: External ear normal.  Eyes: Conjunctivae are normal. No scleral icterus.  Pulmonary/Chest: Effort normal.  Neurological: She is alert and oriented to person, place, and time.  Skin: Skin is warm and dry. She is not diaphoretic. No erythema.  Psychiatric: She has a normal mood and affect. Her behavior is normal.    BP 109/74 (BP Location: Right Arm, Patient Position: Sitting, Cuff Size: Small)   Pulse 78   Temp 97.6 F (36.4 C) (Oral)   Resp 18   Ht 5\' 5"  (1.651 m)   Wt 146 lb (66.2 kg)   LMP 03/08/2016   SpO2 98%   BMI 24.30 kg/m      Results for orders placed or performed in visit on 05/08/16  POCT urinalysis dipstick  Result Value Ref Range   Color, UA yellow yellow   Clarity, UA clear clear   Glucose, UA negative negative   Bilirubin, UA negative negative   Ketones, POC UA negative negative   Spec Grav, UA 1.015    Blood, UA negative negative   pH, UA 5.5    Protein Ur, POC negative negative   Urobilinogen, UA 0.2    Nitrite, UA Negative Negative   Leukocytes, UA Negative Negative  POCT Microscopic Urinalysis ([facility])  Result Value Ref Range   WBC,UR,HPF,POC None None WBC/hpf   RBC,UR,HPF,POC None None RBC/hpf   Bacteria None None, Too numerous to count   Mucus Absent Absent   Epithelial Cells, UR  Per Microscopy None None, Too numerous to count cells/hpf    Assessment & Plan:   1. Recurrent urinary tract infection   Reviewed options. At this point I recommend taking one Keflex after sex. She does not want to do Keflex daily which I agree does not seem indicated at this point. If she can have symptoms remain at bay using one Keflex after sex she will likely need less antibiotic then treating when symptoms occur. However, if she does have sxs ok to dip urine and increase to 1 bid x 7-10d if c/w UTI.  If UTI sxs persist, ok to drop off urine sample for culture - will place future order and change to macrobid in the interim.   Orders Placed This Encounter  Procedures  . Urine  culture  . POCT urinalysis dipstick  . POCT Microscopic Urinalysis ([facility])    Meds ordered this encounter  Medications  . cephALEXin (KEFLEX) 500 MG capsule    Sig: Use daily as recommended by physician for prophylaxis. Increase to bid x 7d for acute infection.    Dispense:  60 capsule    Refill:  1    Norberto Sorenson, M.D.  Primary Care at [facility] [address] [place], [place] [address] [phone] phone [phone] fax  05/08/16 7:12 PM

## 2016-05-08 ENCOUNTER — Encounter: Payer: Self-pay | Admitting: Family Medicine

## 2016-05-08 ENCOUNTER — Ambulatory Visit (INDEPENDENT_AMBULATORY_CARE_PROVIDER_SITE_OTHER): Payer: 59 | Admitting: Family Medicine

## 2016-05-08 VITALS — BP 109/74 | HR 78 | Temp 97.6°F | Resp 18 | Ht 65.0 in | Wt 146.0 lb

## 2016-05-08 DIAGNOSIS — N390 Urinary tract infection, site not specified: Secondary | ICD-10-CM | POA: Diagnosis not present

## 2016-05-08 DIAGNOSIS — R300 Dysuria: Secondary | ICD-10-CM | POA: Diagnosis not present

## 2016-05-08 LAB — POC MICROSCOPIC URINALYSIS (UMFC): Mucus: ABSENT

## 2016-05-08 LAB — POCT URINALYSIS DIP (MANUAL ENTRY)
Bilirubin, UA: NEGATIVE
Blood, UA: NEGATIVE
Glucose, UA: NEGATIVE
Ketones, POC UA: NEGATIVE
Leukocytes, UA: NEGATIVE
Nitrite, UA: NEGATIVE
Protein Ur, POC: NEGATIVE
Spec Grav, UA: 1.015
Urobilinogen, UA: 0.2
pH, UA: 5.5

## 2016-05-08 MED ORDER — CEPHALEXIN 500 MG PO CAPS: ORAL_CAPSULE | ORAL | 1 refills | Status: DC

## 2016-05-08 MED FILL — CEPHALEXIN 500 MG CAPSULE: 500 | 30 days supply | Qty: 60 | Fill #0

## 2016-05-08 NOTE — Patient Instructions (Addendum)
IF you received an x-ray today, you will receive an invoice from [facility]. Please contact [facility] at [phone] with questions or concerns regarding your invoice.   IF you received labwork today, you will receive an invoice from [facility]. Please contact [facility] at [phone] with questions or concerns regarding your invoice.   Our billing staff will not be able to assist you with questions regarding bills from these companies.  You will be contacted with the lab results as soon as they are available. The fastest way to get your results is to activate your My Chart account. Instructions are located on the last page of this paperwork. If you have not heard from us regarding the results in 2 weeks, please contact this office.     Infeco do trato urinrio, adultos (Urinary Tract Infection, Adult) Uma infeco do trato urinrio (ITU)  uma infeco de qualquer parte do trato urinrio, que inclui rins, ureteres, Electrical engineerbexiga e uretra. Esse rgos produzem, armazenam e eliminam a urina do organismo. Uma ITU pode ser uma infeco da bexiga (cistite) ou uma infeco dos rins (pielonefrite). CAUSAS Essa infeco pode ser causada por fungos, vrus ou bactrias. Inda CokeBactrias so a causa mais comum de [address]. Esse quadro clnico tambm pode ser causado por no se esvaziar a bexiga completamente repetidas vezes ao urinar. FATORES DE RISCO Esse quadro clnico tem maior probabilidade de Cabin crewocorrer se voc:  Editor, commissioninggnorar a necessidade de Dentisturinar ou prender a urina por longos perodos.  No esvaziar a bexiga completamente ao urinar.  Limpar-se com movimentos de trs para frente aps urinar ou defecar, caso seja mulher.  For circuncidado, caso seja homem.  Tiver constipao.  Tiver um cateter urinrio que permanece instalado Vandemere(permanente).  Tiver um sistema de defesa (imune) enfraquecido.  Tiver um quadro clnico que afete seus intestinos, rins ou bexiga.  Tiver diabetes.  Tomar  antibiticos frequentemente ou por longos perodos, e esses antibiticos no foram mais eficazes contra certos tipos de infeco (resistncia a antibiticos).  Tomar medicamentos que irritam o seu trato urinrio.  For exposto a substncias qumicas que irritem o seu trato urinrio.  For Raytheonmulher. SINTOMAS Os sintomas desse quadro clnico incluem:  Febre.  Mico frequente ou eliminao frequente de pequenas quantidades de urina.  Necessidade urgente de Saratogaurinar.  Dor ou queimao ao urinar.  Urina com cheiro ruim ou estranho.  Urina turva.  Dor na parte inferior do abdome ou nas costas.  Problemas para urinar.  Sangue na urina.  Vmito ou menos apetite que o normal.  Diarreia ou dor abdominal.  Secreo vaginal, se voc for Raytheonmulher. DIAGNSTICO Esse quadro clnico  diagnosticado com um histrico mdico e um exame fsico. Voc tambm precisar fornecer uma amostra de urina para exame. Outros exames podero ser feitos, incluindo:  Exames de sangue.  Exame para doenas sexualmente transmissveis (DSTs). Caso voc tenha sofrido mais de Valero Energyuma ITU, uma cistoscopia ou exames por imagens podero ser realizados para determinar a Goldman Sachscausa das infeces. TRATAMENTO O tratamento desse quadro clnico com frequncia inclui uma combinao de duas ou [address]ma[address] das seguintes opes:  Antibiticos.  Outros medicamentos para tratar causas menos comuns de WestonUs.  Medicamentos de venda livre para tratar a dor.  Beber gua suficiente para permanecer hidratado. INSTRUES PARA TRATAMENTO DOMICILIAR  Tome medicamentos de venda livre ou vendidos com receita mdica somente de acordo com as indicaes do seu mdico.  Caso tenha recebido prescrio de antibitico, tome-o como o seu mdico determinar. No pare de tomar o antibitico mesmo se  comear a se Passenger transport manager.  Evite lcool, cafena, ch e bebidas carbonatadas. Elas podem irritar sua bexiga.  Beba lquidos em quantidade suficiente para manter  sua urina clara ou na cor amarela plida.  Comparea a todas as consultas de acompanhamento de acordo com as orientaes do seu mdico. Isso  importante.  Certifique-se de:  Esvaziar sua bexiga com frequncia e completamente. No segure a urina por longos perodos.  Esvazie a bexiga antes e aps o sexo.  Limpe-se com movimentos da frente para trs aps defecar se voc for Raytheon. Use cada pedao de papel higinico somente uma vez ao se limpar. PROCURE UM MDICO SE:  Sentir dor nas costas.  Tiver febre.  Sentir Engineer, agricultural.  Seus sintomas no melhorarem aps 3 dias.  Seus sintomas desaparecerem e retornarem. PROCURE UM MDICO IMEDIATAMENTE SE:  Sentir dor intensa nas costas ou na parte inferior do abdome.  Estiver vomitando e no conseguir Web designer. Estas informaes no se destinam a substituir as recomendaes de seu mdico. No deixe de discutir quaisquer dvidas com seu mdico. Document Released: 03/13/2005 Document Revised: 07/05/2015 Document Reviewed: 02/01/2015 Elsevier Interactive Patient Education  2017 [employer].

## 2016-05-10 LAB — URINE CULTURE: ORGANISM ID, BACTERIA: NO GROWTH

## 2016-06-05 ENCOUNTER — Telehealth: Payer: 59 | Admitting: Family

## 2016-06-05 DIAGNOSIS — B373 Candidiasis of vulva and vagina: Secondary | ICD-10-CM

## 2016-06-05 DIAGNOSIS — B3731 Acute candidiasis of vulva and vagina: Secondary | ICD-10-CM

## 2016-06-05 MED ORDER — FLUCONAZOLE 150 MG PO TABS: 150.0000 mg | ORAL_TABLET | ORAL | 0 refills | Status: DC | PRN

## 2016-06-05 MED FILL — FLUCONAZOLE 150 MG TABLET: 150 | 4 days supply | Qty: 2 | Fill #0

## 2016-06-05 NOTE — Progress Notes (Signed)

## 2016-07-05 MED FILL — DASETTA 1-35-28 TABLET: 1-35 | 84 days supply | Qty: 112 | Fill #1

## 2016-10-24 DIAGNOSIS — H5213 Myopia, bilateral: Secondary | ICD-10-CM | POA: Diagnosis not present

## 2016-12-01 ENCOUNTER — Encounter: Payer: Self-pay | Admitting: Family Medicine

## 2016-12-01 ENCOUNTER — Ambulatory Visit (INDEPENDENT_AMBULATORY_CARE_PROVIDER_SITE_OTHER): Payer: 59 | Admitting: Family Medicine

## 2016-12-01 VITALS — BP 98/62 | HR 87 | Temp 98.0°F | Resp 17 | Ht 65.5 in | Wt 140.0 lb

## 2016-12-01 DIAGNOSIS — E538 Deficiency of other specified B group vitamins: Secondary | ICD-10-CM | POA: Diagnosis not present

## 2016-12-01 DIAGNOSIS — Z23 Encounter for immunization: Secondary | ICD-10-CM | POA: Diagnosis not present

## 2016-12-01 DIAGNOSIS — F331 Major depressive disorder, recurrent, moderate: Secondary | ICD-10-CM

## 2016-12-01 MED ORDER — ESCITALOPRAM OXALATE 10 MG PO TABS: ORAL_TABLET | ORAL | 2 refills | Status: DC

## 2016-12-01 MED ORDER — CYANOCOBALAMIN 1000 MCG/ML IJ SOLN
1000.0000 ug | INTRAMUSCULAR | Status: DC
  Administered 2016-12-01: 1000 ug via INTRAMUSCULAR

## 2016-12-01 MED FILL — ESCITALOPRAM 10 MG TABLET: 10 | 30 days supply | Qty: 30 | Fill #0

## 2016-12-01 MED FILL — LARIN 21 1-20 TABLET: 1-20 | 84 days supply | Qty: 84 | Fill #1

## 2016-12-01 NOTE — Patient Instructions (Signed)
Vitamin B12 Deficiency Vitamin B12 deficiency occurs when the body does not have enough vitamin B12. Vitamin B12 is an important vitamin. The body needs vitamin B12:  To make red blood cells.  To make DNA. This is the genetic material inside cells.  To help the nerves work properly so they can carry messages from the brain to the body.  Vitamin B12 deficiency can cause various health problems, such as a low red blood cell count (anemia) or nerve damage. What are the causes? This condition may be caused by:  Not eating enough foods that contain vitamin B12.  Not having enough stomach acid and digestive fluids to properly absorb vitamin B12 from the food that you eat.  Certain digestive system diseases that make it hard to absorb vitamin B12. These diseases include Crohn disease, chronic pancreatitis, and cystic fibrosis.  Pernicious anemia. This is a condition in which the body does not make enough of a protein (intrinsic factor), resulting in too few red blood cells.  Having a surgery in which part of the stomach or small intestine is removed.  Taking certain medicines that make it hard for the body to absorb vitamin B12. These medicines include: ? Heartburn medicine (antacids and proton pump inhibitors). ? An antibiotic medicine called neomycin. ? Some medicines that are used to treat diabetes, tuberculosis, gout, or high cholesterol.  What increases the risk? The following factors may make you more likely to develop a B12 deficiency:  Being older than age 50.  Eating a vegetarian or vegan diet, especially while you are pregnant.  Eating a poor diet while you are pregnant.  Taking certain drugs.  Having alcoholism.  What are the signs or symptoms? In some cases, there are no symptoms of this condition. If the condition leads to anemia or nerve damage, various symptoms can occur, such as:  Weakness.  Fatigue.  Loss of appetite.  Weight loss.  Numbness or tingling  in your hands and feet.  Redness and burning of the tongue.  Confusion or memory problems.  Depression.  Sensory problems, such as color blindness, ringing in the ears, or loss of taste.  Diarrhea or constipation.  Trouble walking.  If anemia is severe, symptoms can include:  Shortness of breath.  Dizziness.  Rapid heart rate (tachycardia).  How is this diagnosed? This condition may be diagnosed with a blood test to measure the level of vitamin B12 in your blood. You may have other tests to help find the cause of your vitamin B12 deficiency. These tests may include:  A complete blood count (CBC). This is a group of tests that measure certain characteristics of blood cells.  A blood test to measure intrinsic factor.  An endoscopy. In this procedure, a thin tube with a camera on the end is used to look into your stomach or intestines.  How is this treated? Treatment for this condition depends on the cause. Common treatment options include:  Changing your eating and drinking habits, such as: ? Eating more foods that contain vitamin B12. ? Drinking less alcohol or no alcohol.  Taking vitamin B12 supplements. Your health care provider will tell you which dosage is best for you.  Getting vitamin B12 injections.  Follow these instructions at home:  Take supplements only as told by your health care provider. Follow the directions carefully.  Get any injections that are prescribed by your health care provider.  Do not miss your appointments.  Eat lots of healthy foods that contain vitamin B12.   Ask your health care provider if you should work with a dietitian. Foods that contain vitamin B12 include: ? Meat. ? Meat from birds (poultry). ? Fish. ? Eggs. ? Cereal and dairy products that are fortified. This means that vitamin B12 has been added to the food. Check the label on the package to see if the food is fortified.  Do not abuse alcohol.  Keep all follow-up visits as  told by your health care provider. This is important. Contact a health care provider if:  Your symptoms come back. Get help right away if:  You develop shortness of breath.  You have chest pain.  You become dizzy or you lose consciousness. This information is not intended to replace advice given to you by your health care provider. Make sure you discuss any questions you have with your health care provider. Document Released: 06/05/2011 Document Revised: 08/25/2015 Document Reviewed: 07/29/2014 Elsevier Interactive Patient Education  2018 [employer]   Major Depressive Disorder, Adult Major depressive disorder (MDD) is a mental health condition. It may also be called clinical depression or unipolar depression. MDD usually causes feelings of sadness, hopelessness, or helplessness. MDD can also cause physical symptoms. It can interfere with work, school, relationships, and other everyday activities. MDD may be mild, moderate, or severe. It may occur once (single episode major depressive disorder) or it may occur multiple times (recurrent major depressive disorder). What are the causes? The exact cause of this condition is not known. MDD is most likely caused by a combination of things, which may include:  Genetic factors. These are traits that are passed along from parent to child.  Individual factors. Your personality, your behavior, and the way you handle your thoughts and feelings may contribute to MDD. This includes personality traits and behaviors learned from others.  Physical factors, such as: ? Differences in the part of your brain that controls emotion. This part of your brain may be different than it is in people who do not have MDD. ? Long-term (chronic) medical or psychiatric illnesses.  Social factors. Traumatic experiences or major life changes may play a role in the development of MDD.  What increases the risk? This condition is more likely to develop in women. The  following factors may also make you more likely to develop MDD:  A family history of depression.  Troubled family relationships.  Abnormally low levels of certain brain chemicals.  Traumatic events in childhood, especially abuse or the loss of a parent.  Being under a lot of stress, or long-term stress, especially from upsetting life experiences or losses.  A history of: ? Chronic physical illness. ? Other mental health disorders. ? Substance abuse.  Poor living conditions.  Experiencing social exclusion or discrimination on a regular basis.  What are the signs or symptoms? The main symptoms of MDD typically include:  Constant depressed or irritable mood.  Loss of interest in things and activities.  MDD symptoms may also include:  Sleeping or eating too much or too little.  Unexplained weight change.  Fatigue or low energy.  Feelings of worthlessness or guilt.  Difficulty thinking clearly or making decisions.  Thoughts of suicide or of harming others.  Physical agitation or weakness.  Isolation.  Severe cases of MDD may also occur with other symptoms, such as:  Delusions or hallucinations, in which you imagine things that are not real (psychotic depression).  Low-level depression that lasts at least a year (chronic depression or persistent depressive disorder).  Extreme sadness and  hopelessness (melancholic depression).  Trouble speaking and moving (catatonic depression).  How is this diagnosed? This condition may be diagnosed based on:  Your symptoms.  Your medical history, including your mental health history. This may involve tests to evaluate your mental health. You may be asked questions about your lifestyle, including any drug and alcohol use, and how long you have had symptoms of MDD.  A physical exam.  Blood tests to rule out other conditions.  You must have a depressed mood and at least four other MDD symptoms most of the day, nearly every  day in the same 2-week timeframe before your health care provider can confirm a diagnosis of MDD. How is this treated? This condition is usually treated by mental health professionals, such as psychologists, psychiatrists, and clinical social workers. You may need more than one type of treatment. Treatment may include:  Psychotherapy. This is also called talk therapy or counseling. Types of psychotherapy include: ? Cognitive behavioral therapy (CBT). This type of therapy teaches you to recognize unhealthy feelings, thoughts, and behaviors, and replace them with positive thoughts and actions. ? Interpersonal therapy (IPT). This helps you to improve the way you relate to and communicate with others. ? Family therapy. This treatment includes members of your family.  Medicine to treat anxiety and depression, or to help you control certain emotions and behaviors.  Lifestyle changes, such as: ? Limiting alcohol and drug use. ? Exercising regularly. ? Getting plenty of sleep. ? Making healthy eating choices. ? Spending more time outdoors.  Treatments involving stimulation of the brain can be used in situations with extremely severe symptoms, or when medicine or other therapies do not work over time. These treatments include electroconvulsive therapy, transcranial magnetic stimulation, and vagal nerve stimulation. Follow these instructions at home: Activity  Return to your normal activities as told by your health care provider.  Exercise regularly and spend time outdoors as told by your health care provider. General instructions  Take over-the-counter and prescription medicines only as told by your health care provider.  Do not drink alcohol. If you drink alcohol, limit your alcohol intake to no more than 1 drink a day for nonpregnant women and 2 drinks a day for men. One drink equals 12 oz of beer, 5 oz of wine, or 1 oz of hard liquor. Alcohol can affect any antidepressant medicines you are  taking. Talk to your health care provider about your alcohol use.  Eat a healthy diet and get plenty of sleep.  Find activities that you enjoy doing, and make time to do them.  Consider joining a support group. Your health care provider may be able to recommend a support group.  Keep all follow-up visits as told by your health care provider. This is important. Where to find more information: The [employer] on Mental Illness  www.nami.org  [employer] of Mental Health  http://www.maynard.net/  National Suicide Prevention Lifeline  1-800-273-TALK 941-013-6817). This is free, 24-hour help.  Contact a health care provider if:  Your symptoms get worse.  You develop new symptoms. Get help right away if:  You self-harm.  You have serious thoughts about hurting yourself or others.  You see, hear, taste, smell, or feel things that are not present (hallucinate). This information is not intended to replace advice given to you by your health care provider. Make sure you discuss any questions you have with your health care provider. Document Released: 07/08/2012 Document Revised: 11/18/2015 Document Reviewed: 09/22/2015 Elsevier Interactive Patient Education  2017 Elsevier Inc.  

## 2016-12-01 NOTE — Progress Notes (Addendum)
Subjective:   Patient was seen in Room 3.   Patient ID: Heather Cole, female    DOB: [birth year], 41 y.o.   MRN: [id number]   Chief Complaint  Patient presents with  . Follow-up  . Depression   HPI Heather Cole is a 41 y.o. female who [facility] complaining of  Decreased dose back down to the 3 mos prior 20mcg and added back in the placebo wks, noticed worsening mood sxs over the past yr and particulalry over the past 7-8 mos. Migraines have been a little worse in the past few months. Has been trying to add in more meat to her diet over the past few months. Less sleep but ok overall - new dog is waking her up. Decreased appetite. Started seeing counselor - Hinda Lenisachel Huddo - somatic counseling. Focusing on mindfullness/meditation - for several months - goes about every 3 wks.  Has had some dysthymia in the past.  Some anhedonia. Diet and exercise still good - still working.  Lemon balm - a little stronger than lavender Ashewaganda takes longer to work. No sig anxiety.  Concentration fine. Energy ok.    No recent recurrent UTIs  Mom has a macrocytic anemia and has been taking b12 hgb last wk still 12.8  Past Medical History:  Diagnosis Date  . Anemia    Prior to Admission medications   Medication Sig Start Date End Date Taking? Authorizing Provider  cephALEXin (KEFLEX) 500 MG capsule Use daily as recommended by physician for prophylaxis. Increase to bid x 7d for acute infection. 05/08/16   Sherren MochaShaw, Kristofer Schaffert N, MD  fluconazole (DIFLUCAN) 150 MG tablet Take 1 tablet (150 mg total) by mouth every three (3) days as needed. 06/05/16   Junie SpencerHawks, Christy A, FNP  nitrofurantoin, macrocrystal-monohydrate, (MACROBID) 100 MG capsule Take 1 capsule (100 mg total) by mouth 2 (two) times daily. 1 po BId 05/03/16   Bennie PieriniMartin, Mary-Margaret, FNP  norethindrone-ethinyl estradiol 1/35 (ORTHO-NOVUM 1/35, 28,) tablet Take 1 tablet by mouth daily. Skipping placebo pills 04/10/16   Sherren MochaShaw, Jenesys Casseus N,  MD  SUMAtriptan (IMITREX) 100 MG tablet Take 0.5 tablets (50 mg total) by mouth every 2 (two) hours as needed for migraine. May repeat in 2 hours if headache persists or recurs. 03/14/16   Sherren MochaShaw, Elgie Landino N, MD   Not on File   No past surgical history on file.  No family history on file. Social History   Social History  . Marital status: Single    Spouse name: N/A  . Number of children: N/A  . Years of education: N/A   Social History Main Topics  . Smoking status: Never Smoker  . Smokeless tobacco: Never Used  . Alcohol use No  . Drug use: No  . Sexual activity: No   Other Topics Concern  . None   Social History Narrative  . None   Depression screen [facility] 12/01/2016 05/08/2016 04/10/2016  Decreased Interest 2 0 0  Down, Depressed, Hopeless 2 0 0  PHQ - 2 Score 4 0 0  Altered sleeping 2 - -  Tired, decreased energy 2 - -  Change in appetite 2 - -  Feeling bad or failure about yourself  2 - -  Trouble concentrating 0 - -  Moving slowly or fidgety/restless 0 - -  Suicidal thoughts 0 - -  PHQ-9 Score 12 - -  Difficult doing work/chores Somewhat difficult - -    Review of Systems See hpi  Objective:   Physical Exam  Constitutional: She is oriented to person, place, and time. She appears well-developed and well-nourished. No distress.  HENT:  Head: Normocephalic and atraumatic.  Right Ear: External ear normal.  Left Ear: External ear normal.  Eyes: Conjunctivae are normal. No scleral icterus.  Neck: Normal range of motion. Neck supple. No thyromegaly present.  Cardiovascular: Normal rate, regular rhythm, normal heart sounds and intact distal pulses.   Pulmonary/Chest: Effort normal and breath sounds normal. No respiratory distress.  Musculoskeletal: She exhibits no edema.  Lymphadenopathy:    She has no cervical adenopathy.  Neurological: She is alert and oriented to person, place, and time.  Skin: Skin is warm and dry. She is not diaphoretic. No erythema.    Psychiatric: She has a normal mood and affect. Her behavior is normal.    BP 98/62   Pulse 87   Temp 98 F (36.7 C) (Oral)   Resp 17   Ht 5' 5.5" (1.664 m)   Wt 140 lb (63.5 kg)   LMP 12/01/2016 (Approximate)   SpO2 98%   BMI 22.94 kg/m      Assessment & Plan:   1. Moderate episode of recurrent major depressive disorder (HCC) - initial trial of medication.   2. Vitamin B12 deficiency - trial of replacement  3. Need for prophylactic vaccination and inoculation against influenza     Orders Placed This Encounter  Procedures  . Flu Vaccine QUAD 36+ mos IM    Meds ordered this encounter  Medications  . escitalopram (LEXAPRO) 10 MG tablet    Sig: Start 1/2 tab daily for 2 wks, then increase to 1 tab po qd.    Dispense:  30 tablet    Refill:  2  . cyanocobalamin ((VITAMIN B-12)) injection 1,000 mcg    I personally performed the services described in this documentation, which was scribed in my presence. The recorded information has been reviewed and considered, and addended by me as needed.   Norberto Sorenson, M.D.  Primary Care at [facility] [address] [place], [place] [address] [phone] phone [phone] fax  12/07/16 11:08 PM

## 2017-01-04 MED FILL — ESCITALOPRAM 10 MG TABLET: 10 | 30 days supply | Qty: 30 | Fill #1

## 2017-02-01 MED FILL — ESCITALOPRAM 10 MG TABLET: 10 | 30 days supply | Qty: 30 | Fill #2

## 2017-02-01 MED FILL — CEPHALEXIN 500 MG CAPSULE: 500 | 30 days supply | Qty: 60 | Fill #1

## 2017-02-21 ENCOUNTER — Encounter: Payer: Self-pay | Admitting: Family Medicine

## 2017-03-06 ENCOUNTER — Other Ambulatory Visit: Payer: Self-pay | Admitting: Family Medicine

## 2017-03-06 ENCOUNTER — Other Ambulatory Visit: Payer: Self-pay | Admitting: *Deleted

## 2017-03-06 MED ORDER — ESCITALOPRAM OXALATE 10 MG PO TABS: ORAL_TABLET | ORAL | 0 refills | Status: DC

## 2017-03-06 NOTE — Telephone Encounter (Signed)
I see where a note was sent requesting if she needed to be seen again prior to this being refilled so I did not refill it.

## 2017-03-08 ENCOUNTER — Other Ambulatory Visit: Payer: Self-pay | Admitting: Family Medicine

## 2017-03-09 ENCOUNTER — Other Ambulatory Visit: Payer: Self-pay | Admitting: Family Medicine

## 2017-03-09 MED ORDER — ESCITALOPRAM OXALATE 10 MG PO TABS: 10.0000 mg | ORAL_TABLET | Freq: Every day | ORAL | 3 refills | Status: DC

## 2017-03-09 MED FILL — ESCITALOPRAM 10 MG TABLET: 10 | 90 days supply | Qty: 90 | Fill #0

## 2017-03-14 MED FILL — LARIN 21 1-20 TABLET: 1-20 | 84 days supply | Qty: 84 | Fill #2

## 2017-05-16 ENCOUNTER — Ambulatory Visit: Payer: Self-pay | Admitting: Family Medicine

## 2017-05-18 ENCOUNTER — Telehealth: Payer: Self-pay | Admitting: Family Medicine

## 2017-05-18 NOTE — Telephone Encounter (Signed)
Called pt- left VM to try and reschedule her appt with Dr. Clelia CroftShaw on 06/06/17.  When pt calls back , please reschedule her with Dr. Clelia CroftShaw at her covenience.   Thanks!

## 2017-06-05 ENCOUNTER — Encounter: Payer: Self-pay | Admitting: Family Medicine

## 2017-06-05 ENCOUNTER — Telehealth: Payer: Self-pay | Admitting: Family Medicine

## 2017-06-05 ENCOUNTER — Other Ambulatory Visit: Payer: Self-pay

## 2017-06-05 ENCOUNTER — Ambulatory Visit (INDEPENDENT_AMBULATORY_CARE_PROVIDER_SITE_OTHER): Payer: No Typology Code available for payment source | Admitting: Family Medicine

## 2017-06-05 VITALS — BP 106/70 | HR 61 | Temp 98.4°F | Resp 18 | Ht 65.5 in | Wt 157.0 lb

## 2017-06-05 DIAGNOSIS — E611 Iron deficiency: Secondary | ICD-10-CM | POA: Diagnosis not present

## 2017-06-05 DIAGNOSIS — Z3009 Encounter for other general counseling and advice on contraception: Secondary | ICD-10-CM

## 2017-06-05 DIAGNOSIS — D7589 Other specified diseases of blood and blood-forming organs: Secondary | ICD-10-CM | POA: Diagnosis not present

## 2017-06-05 DIAGNOSIS — E538 Deficiency of other specified B group vitamins: Secondary | ICD-10-CM

## 2017-06-05 LAB — POCT CBC
Granulocyte percent: 68.7 %G (ref 37–80)
HEMATOCRIT: 35.6 % — AB (ref 37.7–47.9)
Hemoglobin: 11.9 g/dL — AB (ref 12.2–16.2)
LYMPH, POC: 1.9 (ref 0.6–3.4)
MCH, POC: 31.6 pg — AB (ref 27–31.2)
MCHC: 33.5 g/dL (ref 31.8–35.4)
MCV: 94.3 fL (ref 80–97)
MID (cbc): 0.4 (ref 0–0.9)
MPV: 7 fL (ref 0–99.8)
POC GRANULOCYTE: 4.9 (ref 2–6.9)
POC LYMPH %: 25.8 % (ref 10–50)
POC MID %: 5.5 % (ref 0–12)
Platelet Count, POC: 297 10*3/uL (ref 142–424)
RBC: 3.77 M/uL — AB (ref 4.04–5.48)
RDW, POC: 14.9 %
WBC: 7.2 10*3/uL (ref 4.6–10.2)

## 2017-06-05 MED ORDER — CYANOCOBALAMIN 1000 MCG/ML IJ SOLN
1000.0000 ug | INTRAMUSCULAR | Status: DC
  Administered 2017-06-05: 1000 ug via INTRAMUSCULAR

## 2017-06-05 MED ORDER — NORETHIN ACE-ETH ESTRAD-FE 1-20 MG-MCG PO TABS: 1.0000 | ORAL_TABLET | Freq: Every day | ORAL | 4 refills | Status: DC

## 2017-06-05 NOTE — Progress Notes (Addendum)
Subjective:  By signing my name below, I, [name], attest that this documentation has been prepared under the direction and in the presence of Norberto Sorenson, MD. Electronically Signed: [name], Scribe. 06/05/2017 , 2:27 PM .  Patient was seen in Room 3 .   Patient ID: Heather Cole, female    DOB: [birth year], 42 y.o.   MRN: [id number] Chief Complaint  Patient presents with  . Anemia    pt states she would to follow on anemia and B12 levels.  . Follow-up  . Medication Refill    Birth control pills   HPI Heather Cole is a 43 y.o. female who presents to Primary Care at [facility] for follow up. Patient was started on Lexapro 10mg  6 months ago. She also received trial of Vitamin B injection. She does have history of some mild anemia.   Patient had stopped her Lexapro around late January after having some benefit with it. She tapered herself down, and may have had some withdrawal side effects, but states doing better now. She reports feeling much better after the B12 injection received last visit. She checks her levels, and noticed levels still in low 11s. She's currently not taking iron supplements, but has been eating more meats than before. She's taking Vitamin B supplements, but not taking multivitamin consistently.   She also requests refill of her birth control.   She recently returned from [place] 2 weeks ago.   Past Medical History:  Diagnosis Date  . Anemia    History reviewed. No pertinent surgical history. Prior to Admission medications   Medication Sig Start Date End Date Taking? Authorizing Provider  cephALEXin (KEFLEX) 500 MG capsule Use daily as recommended by physician for prophylaxis. Increase to bid x 7d for acute infection. 05/08/16   Sherren Mocha, MD  escitalopram (LEXAPRO) 10 MG tablet Take 1 tablet (10 mg total) by mouth daily. 03/09/17   Sherren Mocha, MD  SUMAtriptan (IMITREX) 100 MG tablet Take 0.5 tablets (50 mg total) by mouth every 2 (two) hours as needed  for migraine. May repeat in 2 hours if headache persists or recurs. 03/14/16   Sherren Mocha, MD   No Known Allergies History reviewed. No pertinent family history. Social History   Socioeconomic History  . Marital status: Single    Spouse name: None  . Number of children: None  . Years of education: None  . Highest education level: None  Social Needs  . Financial resource strain: None  . Food insecurity - worry: None  . Food insecurity - inability: None  . Transportation needs - medical: None  . Transportation needs - non-medical: None  Occupational History  . None  Tobacco Use  . Smoking status: Never Smoker  . Smokeless tobacco: Never Used  Substance and Sexual Activity  . Alcohol use: No  . Drug use: No  . Sexual activity: No  Other Topics Concern  . None  Social History Narrative  . None   Depression screen [facility] 2/9 06/05/2017 12/01/2016 05/08/2016 04/10/2016  Decreased Interest 0 2 0 0  Down, Depressed, Hopeless 0 2 0 0  PHQ - 2 Score 0 4 0 0  Altered sleeping - 2 - -  Tired, decreased energy - 2 - -  Change in appetite - 2 - -  Feeling bad or failure about yourself  - 2 - -  Trouble concentrating - 0 - -  Moving slowly or fidgety/restless - 0 - -  Suicidal thoughts - 0 - -  PHQ-9 Score - 12 - -  Difficult doing work/chores - Somewhat difficult - -    Review of Systems  Constitutional: Negative for chills, fatigue, fever and unexpected weight change.  Respiratory: Negative for cough.   Gastrointestinal: Negative for constipation, diarrhea, nausea and vomiting.  Skin: Negative for rash and wound.  Neurological: Negative for dizziness, weakness and headaches.       Objective:   Physical Exam  Constitutional: She is oriented to person, place, and time. She appears well-developed and well-nourished. No distress.  HENT:  Head: Normocephalic and atraumatic.  Eyes: EOM are normal. Pupils are equal, round, and reactive to light.  Neck: Neck supple.    Cardiovascular: Normal rate.  Murmur heard.  Systolic (ejection, left upper sternal border) murmur is present with a grade of 2/6. Pulmonary/Chest: Effort normal and breath sounds normal. No respiratory distress. She has no wheezes.  Musculoskeletal: Normal range of motion.  Neurological: She is alert and oriented to person, place, and time.  Skin: Skin is warm and dry.  Psychiatric: She has a normal mood and affect. Her behavior is normal.  Nursing note and vitals reviewed.   BP 106/70 (BP Location: Left Arm, Patient Position: Sitting, Cuff Size: Normal)   Pulse 61   Temp 98.4 F (36.9 C) (Oral)   Resp 18   Ht 5' 5.5" (1.664 m)   Wt 157 lb (71.2 kg)   LMP 05/18/2017   SpO2 99%   BMI 25.73 kg/m   Results for orders placed or performed in visit on 06/05/17  POCT CBC  Result Value Ref Range   WBC 7.2 4.6 - 10.2 K/uL   Lymph, poc 1.9 0.6 - 3.4   POC LYMPH PERCENT 25.8 10 - 50 %L   MID (cbc) 0.4 0 - 0.9   POC MID % 5.5 0 - 12 %M   POC Granulocyte 4.9 2 - 6.9   Granulocyte percent 68.7 37 - 80 %G   RBC 3.77 (A) 4.04 - 5.48 M/uL   Hemoglobin 11.9 (A) 12.2 - 16.2 g/dL   HCT, POC 11.935.6 (A) 14.737.7 - 47.9 %   MCV 94.3 80 - 97 fL   MCH, POC 31.6 (A) 27 - 31.2 pg   MCHC 33.5 31.8 - 35.4 g/dL   RDW, POC 82.914.9 %   Platelet Count, POC 297 142 - 424 K/uL   MPV 7.0 0 - 99.8 fL       Assessment & Plan:   1. Macrocytosis   2. Iron deficiency   3. Vitamin B12 deficiency   4. Family planning     Orders Placed This Encounter  Procedures  . Vitamin B12  . Ferritin  . POCT CBC    Meds ordered this encounter  Medications  . cyanocobalamin ((VITAMIN B-12)) injection 1,000 mcg  . norethindrone-ethinyl estradiol (JUNEL FE,GILDESS FE,LOESTRIN FE) 1-20 MG-MCG tablet    Sig: Take 1 tablet by mouth daily.    Dispense:  4 Package    Refill:  4    I personally performed the services described in this documentation, which was scribed in my presence. The recorded information has been  reviewed and considered, and addended by me as needed.   Norberto SorensonEva Zadrian Mccauley, M.D.  [facility] at [facility]  [place] [address] [place] [address] [phone] phone [phone] fax  06/08/17 11:39 AM

## 2017-06-05 NOTE — Telephone Encounter (Signed)
Copied from CRM [phone]#[id number]. Topic: Quick Communication - Rx Refill/Question >> Jun 05, 2017  2:54 PM Raquel SarnaHayes, Teresa G wrote: Colleen CanJunel 1-20  Donette LarrySusan - [facility] [phone]  Has a question regarding pt's Rx.

## 2017-06-05 NOTE — Patient Instructions (Addendum)
Let me know if you feel better over the next few weeks, then we can get self-injection solution. If you want to try wellbutrin, let me know.  IF you received an x-ray today, you will receive an invoice from [facility]. Please contact [facility] at [phone] with questions or concerns regarding your invoice.   IF you received labwork today, you will receive an invoice from [facility]. Please contact [facility] at [phone] with questions or concerns regarding your invoice.   Our billing staff will not be able to assist you with questions regarding bills from these companies.  You will be contacted with the lab results as soon as they are available. The fastest way to get your results is to activate your My Chart account. Instructions are located on the last page of this paperwork. If you have not heard from [place] regarding the results in 2 weeks, please contact this office.     Iron-Rich Diet Iron is a mineral that helps your body to produce hemoglobin. Hemoglobin is a protein in your red blood cells that carries oxygen to your body's tissues. Eating too little iron may cause you to feel weak and tired, and it can increase your risk for infection. Eating enough iron is necessary for your body's metabolism, muscle function, and nervous system. Iron is naturally found in many foods. It can also be added to foods or fortified in foods. There are two types of dietary iron:  Heme iron. Heme iron is absorbed by the body more easily than nonheme iron. Heme iron is found in meat, poultry, and fish.  Nonheme iron. Nonheme iron is found in dietary supplements, iron-fortified grains, beans, and vegetables.  You may need to follow an iron-rich diet if:  You have been diagnosed with iron deficiency or iron-deficiency anemia.  You have a condition that prevents you from absorbing dietary iron, such as: ? Infection in your intestines. ? Celiac disease. This involves long-lasting  (chronic) inflammation of your intestines.  You do not eat enough iron.  You eat a diet that is high in foods that impair iron absorption.  You have lost a lot of blood.  You have heavy bleeding during your menstrual cycle.  You are pregnant.  What is my plan? Your health care provider may help you to determine how much iron you need per day based on your condition. Generally, when a person consumes sufficient amounts of iron in the diet, the following iron needs are met:  Men. ? 59-31 years old: 11 mg per day. ? 78-45 years old: 8 mg per day.  Women. ? 84-29 years old: 15 mg per day. ? 40-70 years old: 18 mg per day. ? Over 2 years old: 8 mg per day. ? Pregnant women: 27 mg per day. ? Breastfeeding women: 9 mg per day.  What do I need to know about an iron-rich diet?  Eat fresh fruits and vegetables that are high in vitamin C along with foods that are high in iron. This will help increase the amount of iron that your body absorbs from food, especially with foods containing nonheme iron. Foods that are high in vitamin C include oranges, peppers, tomatoes, and mango.  Take iron supplements only as directed by your health care provider. Overdose of iron can be life-threatening. If you were prescribed iron supplements, take them with orange juice or a vitamin C supplement.  Cook foods in pots and pans that are made from iron.  Eat nonheme iron-containing foods alongside foods  that are high in heme iron. This helps to improve your iron absorption.  Certain foods and drinks contain compounds that impair iron absorption. Avoid eating these foods in the same meal as iron-rich foods or with iron supplements. These include: ? Coffee, black tea, and red wine. ? Milk, dairy products, and foods that are high in calcium. ? Beans, soybeans, and peas. ? Whole grains.  When eating foods that contain both nonheme iron and compounds that impair iron absorption, follow these tips to absorb  iron better. ? Soak beans overnight before cooking. ? Soak whole grains overnight and drain them before using. ? Ferment flours before baking, such as using yeast in bread dough. What foods can I eat? Grains Iron-fortified breakfast cereal. Iron-fortified whole-wheat bread. Enriched rice. Sprouted grains. Vegetables Spinach. Potatoes with skin. Green peas. Broccoli. Red and green bell peppers. Fermented vegetables. Fruits Prunes. Raisins. Oranges. Strawberries. Mango. Grapefruit. Meats and Other Protein Sources Beef liver. Oysters. Beef. Shrimp. [place]. Chicken. [place]. Sardines. Chickpeas. Nuts. Tofu. Beverages Tomato juice. Fresh orange juice. Prune juice. Hibiscus tea. Fortified instant breakfast shakes. Condiments Tahini. Fermented soy sauce. Sweets and Desserts Black-strap molasses. Other Wheat germ. The items listed above may not be a complete list of recommended foods or beverages. Contact your dietitian for more options. What foods are not recommended? Grains Whole grains. Bran cereal. Bran flour. Oats. Vegetables Artichokes. Brussels sprouts. Kale. Fruits Blueberries. Raspberries. Strawberries. Figs. Meats and Other Protein Sources Soybeans. Products made from soy protein. Dairy Milk. Cream. Cheese. Yogurt. Cottage cheese. Beverages Coffee. Black tea. Red wine. Sweets and Desserts Cocoa. Chocolate. Ice cream. Other Basil. Oregano. Parsley. The items listed above may not be a complete list of foods and beverages to avoid. Contact your dietitian for more information. This information is not intended to replace advice given to you by your health care provider. Make sure you discuss any questions you have with your health care provider. Document Released: 10/25/2004 Document Revised: 10/01/2015 Document Reviewed: 10/08/2013 Elsevier Interactive Patient Education  2018 [employer].  Vitamin B12 Deficiency Vitamin B12 deficiency occurs when the body does not have  enough vitamin B12. Vitamin B12 is an important vitamin. The body needs vitamin B12:  To make red blood cells.  To make DNA. This is the genetic material inside cells.  To help the nerves work properly so they can carry messages from the brain to the body.  Vitamin B12 deficiency can cause various health problems, such as a low red blood cell count (anemia) or nerve damage. What are the causes? This condition may be caused by:  Not eating enough foods that contain vitamin B12.  Not having enough stomach acid and digestive fluids to properly absorb vitamin B12 from the food that you eat.  Certain digestive system diseases that make it hard to absorb vitamin B12. These diseases include Crohn disease, chronic pancreatitis, and cystic fibrosis.  Pernicious anemia. This is a condition in which the body does not make enough of a protein (intrinsic factor), resulting in too few red blood cells.  Having a surgery in which part of the stomach or small intestine is removed.  Taking certain medicines that make it hard for the body to absorb vitamin B12. These medicines include: ? Heartburn medicine (antacids and proton pump inhibitors). ? An antibiotic medicine called neomycin. ? Some medicines that are used to treat diabetes, tuberculosis, gout, or high cholesterol.  What increases the risk? The following factors may make you more likely to develop a B12 deficiency:  Being older than age 41.  Eating a vegetarian or vegan diet, especially while you are pregnant.  Eating a poor diet while you are pregnant.  Taking certain drugs.  Having alcoholism.  What are the signs or symptoms? In some cases, there are no symptoms of this condition. If the condition leads to anemia or nerve damage, various symptoms can occur, such as:  Weakness.  Fatigue.  Loss of appetite.  Weight loss.  Numbness or tingling in your hands and feet.  Redness and burning of the tongue.  Confusion or  memory problems.  Depression.  Sensory problems, such as color blindness, ringing in the ears, or loss of taste.  Diarrhea or constipation.  Trouble walking.  If anemia is severe, symptoms can include:  Shortness of breath.  Dizziness.  Rapid heart rate (tachycardia).  How is this diagnosed? This condition may be diagnosed with a blood test to measure the level of vitamin B12 in your blood. You may have other tests to help find the cause of your vitamin B12 deficiency. These tests may include:  A complete blood count (CBC). This is a group of tests that measure certain characteristics of blood cells.  A blood test to measure intrinsic factor.  An endoscopy. In this procedure, a thin tube with a camera on the end is used to look into your stomach or intestines.  How is this treated? Treatment for this condition depends on the cause. Common treatment options include:  Changing your eating and drinking habits, such as: ? Eating more foods that contain vitamin B12. ? Drinking less alcohol or no alcohol.  Taking vitamin B12 supplements. Your health care provider will tell you which dosage is best for you.  Getting vitamin B12 injections.  Follow these instructions at home:  Take supplements only as told by your health care provider. Follow the directions carefully.  Get any injections that are prescribed by your health care provider.  Do not miss your appointments.  Eat lots of healthy foods that contain vitamin B12. Ask your health care provider if you should work with a dietitian. Foods that contain vitamin B12 include: ? Meat. ? Meat from birds (poultry). ? Fish. ? Eggs. ? Cereal and dairy products that are fortified. This means that vitamin B12 has been added to the food. Check the label on the package to see if the food is fortified.  Do not abuse alcohol.  Keep all follow-up visits as told by your health care provider. This is important. Contact a health care  provider if:  Your symptoms come back. Get help right away if:  You develop shortness of breath.  You have chest pain.  You become dizzy or you lose consciousness. This information is not intended to replace advice given to you by your health care provider. Make sure you discuss any questions you have with your health care provider. Document Released: 06/05/2011 Document Revised: 08/25/2015 Document Reviewed: 07/29/2014 Elsevier Interactive Patient Education  2018 [employer].

## 2017-06-06 ENCOUNTER — Ambulatory Visit: Payer: Self-pay | Admitting: Family Medicine

## 2017-06-06 LAB — VITAMIN B12: VITAMIN B 12: 1743 pg/mL — AB (ref 232–1245)

## 2017-06-06 LAB — FERRITIN: FERRITIN: 27 ng/mL (ref 15–150)

## 2017-06-06 NOTE — Telephone Encounter (Signed)
Call to patient with this question from Pharmacist, Darl PikesSusan. See previous note for contact info.  The patient has been taking Junel continuously(all days of the month). Pharmacist stated the latest Junel refill looks like she is going to take it for 20 days then have a week off for a period. We need to know which 20 days of 30 days that she prefers.

## 2017-06-06 NOTE — Telephone Encounter (Signed)
Patient called to ask how she take Junel as requested by the pharmacy, she says "I take it 3 weeks on 1 week off. I started on Wednesday, 05/23/17 and I always start/stop on a Wednesday." I advised I would let the pharmacy know. [facility] and left detail VM of the above and they can call the office back if they need clarification.

## 2017-06-08 NOTE — Telephone Encounter (Signed)
Phone call to patient pharmacy, pharmacy closed. Clinical staff, call Monday 3/18.

## 2017-06-08 NOTE — Telephone Encounter (Signed)
Sure, ok to try the one with iron as rx'd. If they don't have it in stock, fine to change to the one w/o and pt can just add in an occasional iron supp.

## 2017-06-08 NOTE — Telephone Encounter (Signed)
See attached note from pharmacy.    Pt of Dr. Lianne BushyShaw  [facility]

## 2017-06-08 NOTE — Telephone Encounter (Signed)
Been on plain one since September & pharmacy wants to know if the one with iron is the one that she needs? They want to get this resolved today as today is Friday

## 2017-06-11 ENCOUNTER — Encounter: Payer: Self-pay | Admitting: Family Medicine

## 2017-06-11 MED FILL — LARIN FE 1-20 TABLET: 1-20 | 84 days supply | Qty: 84 | Fill #0

## 2017-06-11 NOTE — Telephone Encounter (Signed)
Phone call to pharmacy, spoke with Aundra MilletMegan, pharmacist. Advised of message from Dr. Clelia CroftShaw. She verbalizes understanding.

## 2017-09-04 ENCOUNTER — Encounter: Payer: Self-pay | Admitting: Family Medicine

## 2017-10-23 MED FILL — AZITHROMYCIN 250 MG TABLET: 250 | 5 days supply | Qty: 6 | Fill #0

## 2018-03-25 ENCOUNTER — Ambulatory Visit: Payer: No Typology Code available for payment source | Admitting: Family Medicine

## 2018-04-02 ENCOUNTER — Telehealth: Payer: Self-pay | Admitting: Family Medicine

## 2018-04-02 NOTE — Telephone Encounter (Signed)
MyChart message sent to pt about their appointment on 04/09/18 with Dr Clelia Croft

## 2018-04-09 ENCOUNTER — Ambulatory Visit: Payer: No Typology Code available for payment source | Admitting: Family Medicine

## 2018-04-09 DIAGNOSIS — H52221 Regular astigmatism, right eye: Secondary | ICD-10-CM | POA: Diagnosis not present

## 2018-04-09 DIAGNOSIS — H5213 Myopia, bilateral: Secondary | ICD-10-CM | POA: Diagnosis not present

## 2018-04-15 ENCOUNTER — Telehealth: Payer: Self-pay | Admitting: Family Medicine

## 2018-04-15 NOTE — Telephone Encounter (Signed)
MyChart message sent to pt about their appointment on 04/29/18 with Dr Shaw. °

## 2018-04-29 ENCOUNTER — Ambulatory Visit: Payer: No Typology Code available for payment source | Admitting: Family Medicine

## 2018-05-07 ENCOUNTER — Telehealth: Payer: Self-pay | Admitting: Family Medicine

## 2018-05-07 NOTE — Telephone Encounter (Signed)
Spoke with pt regarding appt scheduled for 2/18 with Dr. Clelia Croft. Due to Dr. Clelia Croft being on leave, pt will need to be rescheduled. I was able to get pt rescheduled for 3/31 at 9:00 am. I advised pt of time, building number and late policy. Pt acknowledged.

## 2018-05-14 ENCOUNTER — Encounter: Payer: No Typology Code available for payment source | Admitting: Family Medicine

## 2018-05-20 ENCOUNTER — Telehealth: Payer: Self-pay | Admitting: Family Medicine

## 2018-05-20 NOTE — Telephone Encounter (Signed)
Called pt regarding their appt scheduled for 3/31 with Dr. Clelia Croft. Due to Clelia Croft being out of the office, we will need to reschedule the appt. When pt calls back, please reschedule at their convenience. Thank you!

## 2018-05-27 ENCOUNTER — Telehealth: Payer: Self-pay | Admitting: Family Medicine

## 2018-05-27 NOTE — Telephone Encounter (Signed)
mychart message sent to pt about their appointment with Dr Shaw °

## 2018-06-06 DIAGNOSIS — Z01419 Encounter for gynecological examination (general) (routine) without abnormal findings: Secondary | ICD-10-CM | POA: Diagnosis not present

## 2018-06-06 DIAGNOSIS — Z6825 Body mass index (BMI) 25.0-25.9, adult: Secondary | ICD-10-CM | POA: Diagnosis not present

## 2018-06-12 DIAGNOSIS — N83202 Unspecified ovarian cyst, left side: Secondary | ICD-10-CM | POA: Diagnosis not present

## 2018-06-12 DIAGNOSIS — R102 Pelvic and perineal pain: Secondary | ICD-10-CM | POA: Diagnosis not present

## 2018-06-24 ENCOUNTER — Encounter: Payer: No Typology Code available for payment source | Admitting: Family Medicine

## 2018-06-25 ENCOUNTER — Encounter: Payer: No Typology Code available for payment source | Admitting: Family Medicine

## 2018-07-10 MED FILL — LARIN FE 1.5-30 TABLET: 1.5-30 | 28 days supply | Qty: 28 | Fill #0

## 2018-07-10 MED FILL — SUMATRIPTAN SUCC 50 MG TAB: 50 | 30 days supply | Qty: 9 | Fill #0

## 2018-07-23 ENCOUNTER — Telehealth (INDEPENDENT_AMBULATORY_CARE_PROVIDER_SITE_OTHER): Payer: 59 | Admitting: Family Medicine

## 2018-07-23 ENCOUNTER — Other Ambulatory Visit: Payer: Self-pay

## 2018-07-23 DIAGNOSIS — F320 Major depressive disorder, single episode, mild: Secondary | ICD-10-CM | POA: Diagnosis not present

## 2018-07-23 MED ORDER — BUPROPION HCL ER (XL) 150 MG PO TB24: 150.0000 mg | ORAL_TABLET | Freq: Every day | ORAL | 0 refills | Status: DC

## 2018-07-23 MED FILL — buPROPion HCL ER (XL) 150 M: 150 | 90 days supply | Qty: 90 | Fill #0

## 2018-07-23 NOTE — Progress Notes (Signed)
CC- depression- would like a rx for Wellbutrin. Was on Lexapro before but it was not very hopeful so I stop it.  Marland Kitchen

## 2018-07-23 NOTE — Patient Instructions (Signed)
° ° ° °  If you have lab work done today you will be contacted with your lab results within the next 2 weeks.  If you have not heard from us then please contact us. The fastest way to get your results is to register for My Chart. ° ° °IF you received an x-ray today, you will receive an invoice from [facility]. Please contact  Radiology at [phone] with questions or concerns regarding your invoice.  ° °IF you received labwork today, you will receive an invoice from [facility]. Please contact [facility] at [phone] with questions or concerns regarding your invoice.  ° °Our billing staff will not be able to assist you with questions regarding bills from these companies. ° °You will be contacted with the lab results as soon as they are available. The fastest way to get your results is to activate your My Chart account. Instructions are located on the last page of this paperwork. If you have not heard from us regarding the results in 2 weeks, please contact this office. °  ° ° ° °

## 2018-07-23 NOTE — Progress Notes (Signed)
Telemedicine Encounter- SOAP NOTE Established Patient  This telephone encounter was conducted with the patient's (or proxy's) verbal consent via audio telecommunications: yes/no: Yes Patient was instructed to have this encounter in a suitably private space; and to only have persons present to whom they give permission to participate. In addition, patient identity was confirmed by use of name plus two identifiers (DOB and address).  I discussed the limitations, risks, security and privacy concerns of performing an evaluation and management service by telephone and the availability of in person appointments. I also discussed with the patient that there may be a patient responsible charge related to this service. The patient expressed understanding and agreed to proceed.  I spent a total of TIME; 0 MIN TO 60 MIN: 15 minutes talking with the patient or their proxy.  CC: depression  Subjective   Heather Cole is a 43 y.o. established patient. Telephone visit today for  HPI   Patient reports that she discussed Wellbutrin for depression She states that her appointments were rescheduled due to Dr. Alver FisherShaw's schedule She tried lexapro 4-5 months but it did not help her She states that she has been dealing with depression for years She denies hospitalization or suicidal thoughts She reports that she mostly feels like she is moving slowly or low we She does not have a history of seizures She has about 7 alcoholic drinks a week She runs about 20 miles a week. She does not stress eat and eats regular  Meals.  [facility]2018 06/05/2017 12/01/2016 05/08/2016 04/10/2016  Decreased Interest 1 0 2 0 0  Down, Depressed, Hopeless 1 0 2 0 0  PHQ - 2 Score 2 0 4 0 0  Altered sleeping 0 - 2 - -  Tired, decreased energy 1 - 2 - -  Change in appetite 0 - 2 - -  Feeling bad or failure about yourself  0 - 2 - -  Trouble concentrating 0 - 0 - -  Moving slowly or fidgety/restless 2 - 0 - -   Suicidal thoughts 0 - 0 - -  PHQ-9 Score 5 - 12 - -  Difficult doing work/chores Somewhat difficult - Somewhat difficult - -     Patient Active Problem List   Diagnosis Date Noted  . History of systemic reaction to bee sting 04/09/2016  . Migraine without aura and without status migrainosus, not intractable 04/09/2016  . Iron deficiency anemia due to chronic blood loss 04/09/2016  . Menorrhagia with regular cycle 04/09/2016    Past Medical History:  Diagnosis Date  . Anemia     Current Outpatient Medications  Medication Sig Dispense Refill  . SUMAtriptan (IMITREX) 100 MG tablet Take 0.5 tablets (50 mg total) by mouth every 2 (two) hours as needed for migraine. May repeat in 2 hours if headache persists or recurs. 10 tablet 2  . buPROPion (WELLBUTRIN XL) 150 MG 24 hr tablet Take 1 tablet (150 mg total) by mouth daily. 90 tablet 0  . norethindrone-ethinyl estradiol (JUNEL FE,GILDESS FE,LOESTRIN FE) 1-20 MG-MCG tablet Take 1 tablet by mouth daily. (Patient not taking: Reported on 07/23/2018) 4 Package 4   No current facility-administered medications for this visit.     No Active Allergies  Social History   Socioeconomic History  . Marital status: Single    Spouse name: Not on file  . Number of children: Not on file  . Years of education: Not on file  . Highest education level: Not on  file  Occupational History  . Not on file  Social Needs  . Financial resource strain: Not on file  . Food insecurity:    Worry: Not on file    Inability: Not on file  . Transportation needs:    Medical: Not on file    Non-medical: Not on file  Tobacco Use  . Smoking status: Never Smoker  . Smokeless tobacco: Never Used  Substance and Sexual Activity  . Alcohol use: No  . Drug use: No  . Sexual activity: Never  Lifestyle  . Physical activity:    Days per week: Not on file    Minutes per session: Not on file  . Stress: Not on file  Relationships  . Social connections:    Talks on  phone: Not on file    Gets together: Not on file    Attends religious service: Not on file    Active member of club or organization: Not on file    Attends meetings of clubs or organizations: Not on file    Relationship status: Not on file  . Intimate partner violence:    Fear of current or ex partner: Not on file    Emotionally abused: Not on file    Physically abused: Not on file    Forced sexual activity: Not on file  Other Topics Concern  . Not on file  Social History Narrative  . Not on file    ROS Review of Systems  Constitutional: Negative for activity change, appetite change, chills and fever.  Respiratory: Negative for cough, shortness of breath and wheezing.   Gastrointestinal: Negative for diarrhea, nausea and vomiting.   Neurological: Negative for dizziness, speech difficulty, light-headedness and numbness.  See HPI. All other review of systems negative.   Objective   Vitals as reported by the patient: There were no vitals filed for this visit.  Diagnoses and all orders for this visit:  Depression, major, single episode, mild (HCC) -     buPROPion (WELLBUTRIN XL) 150 MG 24 hr tablet; Take 1 tablet (150 mg total) by mouth daily.  -  Discussed common contraindications and adverse reaction of wellbutrin Discussed continuing regular meals Discussed starting the first week by taking every other day then increase to daily 3 months follow up for recheck on mood    I discussed the assessment and treatment plan with the patient. The patient was provided an opportunity to ask questions and all were answered. The patient agreed with the plan and demonstrated an understanding of the instructions.   The patient was advised to call back or seek an in-person evaluation if the symptoms worsen or if the condition fails to improve as anticipated.  I provided 15 minutes of non-face-to-face time during this encounter.  Doristine Bosworth, MD  Primary Care at [facility]

## 2018-07-30 DIAGNOSIS — N83209 Unspecified ovarian cyst, unspecified side: Secondary | ICD-10-CM | POA: Diagnosis not present

## 2018-07-30 DIAGNOSIS — N83292 Other ovarian cyst, left side: Secondary | ICD-10-CM | POA: Diagnosis not present

## 2018-07-30 DIAGNOSIS — N83291 Other ovarian cyst, right side: Secondary | ICD-10-CM | POA: Diagnosis not present

## 2018-08-16 NOTE — Progress Notes (Signed)
Called pt to set up appt. Pt not ready to schedule appt at this time FR

## 2018-09-10 DIAGNOSIS — N83209 Unspecified ovarian cyst, unspecified side: Secondary | ICD-10-CM | POA: Diagnosis not present

## 2018-09-10 DIAGNOSIS — Z8759 Personal history of other complications of pregnancy, childbirth and the puerperium: Secondary | ICD-10-CM | POA: Diagnosis not present

## 2018-09-10 DIAGNOSIS — Z130 Encounter for screening for diseases of the blood and blood-forming organs and certain disorders involving the immune mechanism: Secondary | ICD-10-CM | POA: Diagnosis not present

## 2018-09-12 ENCOUNTER — Telehealth: Payer: Self-pay | Admitting: *Deleted

## 2018-09-12 NOTE — Telephone Encounter (Signed)
Per Seth Bake I have called and spoke with the patient regarding her appt. Gave all the instructions, and policies

## 2018-09-12 NOTE — Telephone Encounter (Signed)
Called and left Heather Cole at Dr. [name] office a message with the appt date/time. Appt on hold, patient has my chart and is not aware of the appt

## 2018-09-20 ENCOUNTER — Other Ambulatory Visit: Payer: Self-pay

## 2018-09-20 ENCOUNTER — Encounter: Payer: Self-pay | Admitting: Gynecologic Oncology

## 2018-09-20 ENCOUNTER — Inpatient Hospital Stay: Payer: 59 | Attending: Gynecologic Oncology | Admitting: Gynecologic Oncology

## 2018-09-20 VITALS — BP 118/73 | HR 70 | Temp 98.9°F | Resp 19 | Ht 65.5 in | Wt 155.2 lb

## 2018-09-20 DIAGNOSIS — N83202 Unspecified ovarian cyst, left side: Secondary | ICD-10-CM | POA: Diagnosis not present

## 2018-09-20 DIAGNOSIS — R971 Elevated cancer antigen 125 [CA 125]: Secondary | ICD-10-CM | POA: Diagnosis not present

## 2018-09-20 DIAGNOSIS — N940 Mittelschmerz: Secondary | ICD-10-CM | POA: Diagnosis not present

## 2018-09-20 NOTE — H&P (View-Only) (Signed)
Consult Note: Gyn-Onc  Consult was requested by Dr. Amado NashAlmquist for the evaluation of Jonetta OsgoodKirsten Kirstein 43 y.o. female  CC:  Chief Complaint  Patient presents with  . Cyst of left ovary  . 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  year old with a complex left ovarian cyst and elevated CA 125 in the setting of cyclical pelvic pain. I discussed that in the setting of an elevated Ca1 25 and complex adnexal mass I recommend evaluation of the upper abdomen with a CT abdomen and pelvis to rule out carcinomatosis.  Provided this is normal and reassuring 2 options would be reasonable.  The first would be to proceed with surgical evaluation with robotic assisted left salpingo-oophorectomy, frozen section, possible staging.  The next would be to further evaluate over time this adnexal lesion with repeat ultrasound scan in 6 weeks time and repeat Ca1 25 at the same time.  I would recommend repeating ultrasound and Ca1 25 at the same location [facility]) in order to maintain ability to compare results.  I favor the first option which is surgical intervention, however the patient has some reservations about tackling a surgery at this time including time off of work.  Overall I feel that it is reasonable to wait the 6-week period for observation if she feels strongly about not proceeding with surgery.  She will consider her options further as we await the results of her CT scan and will notify us if she desires to proceed with surgery at this time.  In anticipation of potential surgery I discussed what the surgical approach would involve, the anticipated recovery time (2 to 4 weeks, and 2 weeks preoperatively for quarantine).  I discussed with the patient surgical risks.  I discussed that this ovarian mass may in fact be an endometrioma, which might explain some of her discomfort.  I discussed that removal of an endometrioma does improve fertility, and  therefore given that she has been attempting to conceive, it might be beneficial to have removal of even a benign ovarian mass for this purpose.  I discussed that if malignancy is found on frozen section from resection of the ovary, we would proceed with staging.  Staging could involve either a fertility preserving procedure with preservation of the uterus and contralateral ovary and tube provided these appear grossly normal and the disease is otherwise clinical stage I.  The patient has unsure as to whether or not she would want fertility preserving surgery if a malignancy is identified.  She would notify us further about her wishes should she decide to proceed with surgery.  We left the encounter with her planning to undergo a CT scan of the abdomen and pelvis and will notify her of these results she will notify us as to whether or not she would like to proceed with surgery at this time or follow-up with Dr. Amado NashAlmquist for repeat ultrasound and Ca1 25 in 6 weeks.  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  The patient's history began when she developed a spontaneous abortion in February 2020 after a natural conception.  She has a history of anemia and planned an ultrasound evaluation to ensure that she does not have uterine fibroids.  A transvaginal ultrasound scan was performed on June 12, 2018 which revealed on the left  ovary a complex hemorrhagic appearing cyst with peripheral blood flow measuring 2 2 x 1.8 cm, and a 2.2 x 2 x 2 cm solid-appearing lesion.  There was no increased blood flow.  The right ovary contained a solid-appearing lesion measuring 1.4 cm.  There was no increased blood flow.  No free fluid was seen.  A plan was made for repeat ultrasound scan 6 weeks later.  Ca1 25 drawn on Aug 01, 2018 was normal at 29.1.  A repeat ultrasound scan was performed on Jul 30, 2018 which revealed a left ovary containing a complex cyst about 3 cm that was mostly fluid-filled with slightly irregular borders.  There was also soft tissue nodule in the left ovary measuring about 1 x 2 cm with no internal blood flow.  The previous ovarian complex cyst had been about 2 cm with a possible solid structure that was slightly larger than the soft tissue nodule that was noted in the subsequent May ultrasound.  The right ovary contained a complex cyst measuring 2 cm with peripheral internal vascularity that was possibly hemorrhagic cyst and appeared new from the prior ultrasound.  She developed another spontaneous miscarriage in May 2020.  She returned for a subsequent follow-up ultrasound scan and Ca1 25 6 weeks after this May ultrasound scan.  It was performed on September 10, 2018.  This revealed that the right complex cyst was again noted but smaller measuring 1.6 cm.  The left ovary has look appears to be a 3 separate complex cystic areas.  One measured 4 x 3.3 x 2.9 cm and has a multicystic appearance either cystadenoma or hemorrhagic versus other etiology, a 4.2 x 1.8 x 4 cm complex debris-filled cyst with irregular borders, and a 3.2 x 2.1 x 2.5 cm complex cyst with internal debris and soft tissue nodular area.  No vascularity was noted.  hCG was drawn and was normal per patient.   Ca1 25 was drawn on that same day September 10, 2018 and had increased to 60.2.  The patient reports no abdominal symptoms including no bloating, early satiety or abdominal distention.  She denies dyspareunia.  She does report cyclical pelvic pain mostly mittelschmerz pain with ovulation.  She has some history of menorrhagia but otherwise regular menses still.  She is attempting to conceive with her partner for desired pregnancy.  She has no history of a full-term pregnancy.  She has a history of abnormal Pap with negative high-risk HPV in 2018.  Medical history is otherwise unremarkable.  She has a family history  significant for an aunt with premenopausal breast cancer.  She is never had abdominal surgeries.  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  Current Meds:  Outpatient Encounter Medications as of 09/20/2018  Medication Sig  . buPROPion (WELLBUTRIN XL) 150 MG 24 hr tablet Take 1 tablet (150 mg total) by mouth daily.  . SUMAtriptan (IMITREX) 100 MG tablet Take 0.5 tablets (50 mg total) by mouth every 2 (two) hours as needed for migraine. May repeat in 2 hours if headache persists or recurs. (Patient not taking: Reported on 09/20/2018)  . [DISCONTINUED] LARIN FE 1.5/30 1.5-30 MG-MCG tablet   . [DISCONTINUED] norethindrone-ethinyl estradiol (JUNEL FE,GILDESS FE,LOESTRIN FE) 1-20 MG-MCG tablet Take 1 tablet by mouth daily. (Patient not taking: Reported on 07/23/2018)   No facility-administered encounter medications on file as of 09/20/2018.     Allergy:  Allergies  Allergen Reactions  . Bee Venom Anaphylaxis  . Sulfa Antibiotics Itching    Social  Hx:   Social History   Socioeconomic History  . Marital status: Single    Spouse name: Not on file  . Number of children: Not on file  . Years of education: Not on file  . Highest education level: Not on file  Occupational History  . Not on file  Social Needs  . Financial resource strain: Not on file  . Food insecurity    Worry: Not on file    Inability: Not on file  . Transportation needs    Medical: Not on file    Non-medical: Not on file  Tobacco Use  . Smoking status: Never Smoker  . Smokeless tobacco: Never Used  Substance and Sexual Activity  . Alcohol use: Yes    Comment: occasional drink  . Drug use: No  . Sexual activity: Yes  Lifestyle  . Physical activity    Days per week: Not on file    Minutes per session: Not on file  . Stress: Not on file  Relationships  . Social Musicianconnections    Talks on phone: Not on file    Gets together: Not on file    Attends religious service: Not on file    Active member of club or  organization: Not on file    Attends meetings of clubs or organizations: Not on file    Relationship status: Not on file  . Intimate partner violence    Fear of current or ex partner: Not on file    Emotionally abused: Not on file    Physically abused: Not on file    Forced sexual activity: Not on file  Other Topics Concern  . Not on file  Social History Narrative  . Not on file    Past Surgical Hx: History reviewed. No pertinent surgical history.  Past Medical Hx:  Past Medical History:  Diagnosis Date  . Anemia   . History of spontaneous abortion    (2) 7 week Miscarriages last 04-2018  . Migraines     Past Gynecological History:  No LMP recorded.  Family Hx:  Family History  Problem Relation Age of Onset  . Lymphoma Paternal Grandmother   . Breast cancer Paternal Aunt     Review of Systems:  Constitutional  Feels well,   ENT Normal appearing ears and nares bilaterally Skin/Breast  No rash, sores, jaundice, itching, dryness Cardiovascular  No chest pain, shortness of breath, or edema  Pulmonary  No cough or wheeze.  Gastro Intestinal  No nausea, vomitting, or diarrhoea. No bright red blood per rectum, no abdominal pain, change in bowel movement, or constipation.  Genito Urinary  No frequency, urgency, dysuria, no irregular bleeding Musculo Skeletal  No myalgia, arthralgia, joint swelling or pain  Neurologic  No weakness, numbness, change in gait,  Psychology  No depression, anxiety, insomnia.   Vitals:  Blood pressure 118/73, pulse 70, temperature 98.9 F (37.2 C), temperature source Oral, resp. rate 19, height 5' 5.5" (1.664 m), weight 155 lb 3.2 oz (70.4 kg), SpO2 100 %.  Physical Exam: WD in NAD Neck  Supple NROM, without any enlargements.  Lymph Node Survey No cervical supraclavicular or inguinal adenopathy Cardiovascular  Pulse normal rate, regularity and rhythm. S1 and S2 normal.  Lungs  Clear to auscultation bilateraly, without  wheezes/crackles/rhonchi. Good air movement.  Skin  No rash/lesions/breakdown  Psychiatry  Alert and oriented to person, place, and time  Abdomen  Normoactive bowel sounds, abdomen soft, non-tender and nonobese without evidence of hernia.  Back  No CVA tenderness Genito Urinary  Vulva/vagina: Normal external female genitalia.  No lesions. No discharge or bleeding.  Bladder/urethra:  No lesions or masses, well supported bladder  Vagina: normal  Cervix: tilted posteriorally, Normal appearing, no lesions.  Uterus:  Small, mobile, no parametrial involvement or nodularity.  Adnexa: no discretely palpable masses. Rectal  Good tone, no masses no cul de sac nodularity.  Extremities  No bilateral cyanosis, clubbing or edema.   Thereasa Solo, MD  09/20/2018, 2:49 PM

## 2018-09-20 NOTE — Patient Instructions (Signed)
Dr Denman George is recommending a CT scan to evaluate the upper abdomen to ensure there is no carcinomatosis.  If normal, it is reasonable to repeat the ultrasound with Dr [name] in 6 weeks with a repeat CA 125. Alternatively, we could proceed with definitive surgical diagnosis with a robotic assisted removal of the left tube and ovary, possible staging (with lymph node dissection and omentectomy) if cancer is found. A hysterectomy and removal of the right tube and ovary would also be offered to you if you do not desire future fertility and cancer is found in the left ovary.  Dr Serita Grit office can be reached at [phone].

## 2018-09-20 NOTE — Progress Notes (Signed)
Consult Note: Gyn-Onc  Consult was requested by Dr. Amado NashAlmquist for the evaluation of Heather Cole 43 y.o. female  CC:  Chief Complaint  Patient presents with  . Cyst of left ovary  . 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 I discussed that in the setting of an elevated Ca1 25 and complex adnexal mass I recommend evaluation of the upper abdomen with a CT abdomen and pelvis to rule out carcinomatosis.  Provided this is normal and reassuring 2 options would be reasonable.  The first would be to proceed with surgical evaluation with robotic assisted left salpingo-oophorectomy, frozen section, possible staging.  The next would be to further evaluate over time this adnexal lesion with repeat ultrasound scan in 6 weeks time and repeat Ca1 25 at the same time.  I would recommend repeating ultrasound and Ca1 25 at the same location [facility]) in order to maintain ability to compare results.  I favor the first option which is surgical intervention, however the patient has some reservations about tackling a surgery at this time including time off of work.  Overall I feel that it is reasonable to wait the 6-week period for observation if she feels strongly about not proceeding with surgery.  She will consider her options further as we await the results of her CT scan and will notify us if she desires to proceed with surgery at this time.  In anticipation of potential surgery I discussed what the surgical approach would involve, the anticipated recovery time (2 to 4 weeks, and 2 weeks preoperatively for quarantine).  I discussed with the patient surgical risks.  I discussed that this ovarian mass may in fact be an endometrioma, which might explain some of her discomfort.  I discussed that removal of an endometrioma does improve fertility, and  therefore given that she has been attempting to conceive, it might be beneficial to have removal of even a benign ovarian mass for this purpose.  I discussed that if malignancy is found on frozen section from resection of the ovary, we would proceed with staging.  Staging could involve either a fertility preserving procedure with preservation of the uterus and contralateral ovary and tube provided these appear grossly normal and the disease is otherwise clinical stage I.  The patient has unsure as to whether or not she would want fertility preserving surgery if a malignancy is identified.  She would notify us further about her wishes should she decide to proceed with surgery.  We left the encounter with her planning to undergo a CT scan of the abdomen and pelvis and will notify her of these results she will notify us as to whether or not she would like to proceed with surgery at this time or follow-up with Dr. Amado NashAlmquist for repeat ultrasound and Ca1 25 in 6 weeks.  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  The patient's history began when she developed a spontaneous abortion in February 2020 after a natural conception.  She has a history of anemia and planned an ultrasound evaluation to ensure that she does not have uterine fibroids.  A transvaginal ultrasound scan was performed on June 12, 2018 which revealed on the left  ovary a complex hemorrhagic appearing cyst with peripheral blood flow measuring 2 2 x 1.8 cm, and a 2.2 x 2 x 2 cm solid-appearing lesion.  There was no increased blood flow.  The right ovary contained a solid-appearing lesion measuring 1.4 cm.  There was no increased blood flow.  No free fluid was seen.  A plan was made for repeat ultrasound scan 6 weeks later.  Ca1 25 drawn on Aug 01, 2018 was normal at 29.1.  A repeat ultrasound scan was performed on Jul 30, 2018 which revealed a left ovary containing a complex cyst about 3 cm that was mostly fluid-filled with slightly irregular borders.  There was also soft tissue nodule in the left ovary measuring about 1 x 2 cm with no internal blood flow.  The previous ovarian complex cyst had been about 2 cm with a possible solid structure that was slightly larger than the soft tissue nodule that was noted in the subsequent May ultrasound.  The right ovary contained a complex cyst measuring 2 cm with peripheral internal vascularity that was possibly hemorrhagic cyst and appeared new from the prior ultrasound.  She developed another spontaneous miscarriage in May 2020.  She returned for a subsequent follow-up ultrasound scan and Ca1 25 6 weeks after this May ultrasound scan.  It was performed on September 10, 2018.  This revealed that the right complex cyst was again noted but smaller measuring 1.6 cm.  The left ovary has look appears to be a 3 separate complex cystic areas.  One measured 4 x 3.3 x 2.9 cm and has a multicystic appearance either cystadenoma or hemorrhagic versus other etiology, a 4.2 x 1.8 x 4 cm complex debris-filled cyst with irregular borders, and a 3.2 x 2.1 x 2.5 cm complex cyst with internal debris and soft tissue nodular area.  No vascularity was noted.  hCG was drawn and was normal per patient.   Ca1 25 was drawn on that same day September 10, 2018 and had increased to 60.2.  The patient reports no abdominal symptoms including no bloating, early satiety or abdominal distention.  She denies dyspareunia.  She does report cyclical pelvic pain mostly mittelschmerz pain with ovulation.  She has some history of menorrhagia but otherwise regular menses still.  She is attempting to conceive with her partner for desired pregnancy.  She has no history of a full-term pregnancy.  She has a history of abnormal Pap with negative high-risk HPV in 2018.  Medical history is otherwise unremarkable.  She has a family history  significant for an aunt with premenopausal breast cancer.  She is never had abdominal surgeries.  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  Current Meds:  Outpatient Encounter Medications as of 09/20/2018  Medication Sig  . buPROPion (WELLBUTRIN XL) 150 MG 24 hr tablet Take 1 tablet (150 mg total) by mouth daily.  . SUMAtriptan (IMITREX) 100 MG tablet Take 0.5 tablets (50 mg total) by mouth every 2 (two) hours as needed for migraine. May repeat in 2 hours if headache persists or recurs. (Patient not taking: Reported on 09/20/2018)  . [DISCONTINUED] LARIN FE 1.5/30 1.5-30 MG-MCG tablet   . [DISCONTINUED] norethindrone-ethinyl estradiol (JUNEL FE,GILDESS FE,LOESTRIN FE) 1-20 MG-MCG tablet Take 1 tablet by mouth daily. (Patient not taking: Reported on 07/23/2018)   No facility-administered encounter medications on file as of 09/20/2018.     Allergy:  Allergies  Allergen Reactions  . Bee Venom Anaphylaxis  . Sulfa Antibiotics Itching    Social  Hx:   Social History   Socioeconomic History  . Marital status: Single    Spouse name: Not on file  . Number of children: Not on file  . Years of education: Not on file  . Highest education level: Not on file  Occupational History  . Not on file  Social Needs  . Financial resource strain: Not on file  . Food insecurity    Worry: Not on file    Inability: Not on file  . Transportation needs    Medical: Not on file    Non-medical: Not on file  Tobacco Use  . Smoking status: Never Smoker  . Smokeless tobacco: Never Used  Substance and Sexual Activity  . Alcohol use: Yes    Comment: occasional drink  . Drug use: No  . Sexual activity: Yes  Lifestyle  . Physical activity    Days per week: Not on file    Minutes per session: Not on file  . Stress: Not on file  Relationships  . Social connections    Talks on phone: Not on file    Gets together: Not on file    Attends religious service: Not on file    Active member of club or  organization: Not on file    Attends meetings of clubs or organizations: Not on file    Relationship status: Not on file  . Intimate partner violence    Fear of current or ex partner: Not on file    Emotionally abused: Not on file    Physically abused: Not on file    Forced sexual activity: Not on file  Other Topics Concern  . Not on file  Social History Narrative  . Not on file    Past Surgical Hx: History reviewed. No pertinent surgical history.  Past Medical Hx:  Past Medical History:  Diagnosis Date  . Anemia   . History of spontaneous abortion    (2) 7 week Miscarriages last 04-2018  . Migraines     Past Gynecological History:  No LMP recorded.  Family Hx:  Family History  Problem Relation Age of Onset  . Lymphoma Paternal Grandmother   . Breast cancer Paternal Aunt     Review of Systems:  Constitutional  Feels well,   ENT Normal appearing ears and nares bilaterally Skin/Breast  No rash, sores, jaundice, itching, dryness Cardiovascular  No chest pain, shortness of breath, or edema  Pulmonary  No cough or wheeze.  Gastro Intestinal  No nausea, vomitting, or diarrhoea. No bright red blood per rectum, no abdominal pain, change in bowel movement, or constipation.  Genito Urinary  No frequency, urgency, dysuria, no irregular bleeding Musculo Skeletal  No myalgia, arthralgia, joint swelling or pain  Neurologic  No weakness, numbness, change in gait,  Psychology  No depression, anxiety, insomnia.   Vitals:  Blood pressure 118/73, pulse 70, temperature 98.9 F (37.2 C), temperature source Oral, resp. rate 19, height 5' 5.5" (1.664 m), weight 155 lb 3.2 oz (70.4 kg), SpO2 100 %.  Physical Exam: WD in NAD Neck  Supple NROM, without any enlargements.  Lymph Node Survey No cervical supraclavicular or inguinal adenopathy Cardiovascular  Pulse normal rate, regularity and rhythm. S1 and S2 normal.  Lungs  Clear to auscultation bilateraly, without  wheezes/crackles/rhonchi. Good air movement.  Skin  No rash/lesions/breakdown  Psychiatry  Alert and oriented to person, place, and time  Abdomen  Normoactive bowel sounds, abdomen soft, non-tender and nonobese without evidence of hernia.  Back   No CVA tenderness Genito Urinary  Vulva/vagina: Normal external female genitalia.  No lesions. No discharge or bleeding.  Bladder/urethra:  No lesions or masses, well supported bladder  Vagina: normal  Cervix: tilted posteriorally, Normal appearing, no lesions.  Uterus:  Small, mobile, no parametrial involvement or nodularity.  Adnexa: no discretely palpable masses. Rectal  Good tone, no masses no cul de sac nodularity.  Extremities  No bilateral cyanosis, clubbing or edema.   Thereasa Solo, MD  09/20/2018, 2:49 PM

## 2018-09-23 ENCOUNTER — Encounter: Payer: Self-pay | Admitting: Gynecologic Oncology

## 2018-09-23 ENCOUNTER — Telehealth: Payer: Self-pay | Admitting: *Deleted

## 2018-09-23 NOTE — Telephone Encounter (Signed)
Patient called and wanted information on having surgery. Explained that we would forward the message to [facility] APP and call her back.

## 2018-09-24 ENCOUNTER — Telehealth: Payer: Self-pay | Admitting: Gynecologic Oncology

## 2018-09-24 ENCOUNTER — Encounter: Payer: Self-pay | Admitting: Gynecologic Oncology

## 2018-09-24 NOTE — Telephone Encounter (Signed)
Returned call to patient.  Potential surgery dates discussed and patient decided upon July 9.  Reinforced the recommendation from Dr. Denman George for self quarantine for 2 weeks pre-op.  All questions answered.  She states she would like to keep her right tube/ovary and uterus if a cancer was identified. Advised she would be contacted with further information pertaining to her surgery. Pre-op information sent to her in mychart.

## 2018-09-25 ENCOUNTER — Encounter: Payer: Self-pay | Admitting: Gynecologic Oncology

## 2018-09-25 ENCOUNTER — Telehealth: Payer: Self-pay | Admitting: Gynecologic Oncology

## 2018-09-25 ENCOUNTER — Other Ambulatory Visit: Payer: Self-pay | Admitting: Gynecologic Oncology

## 2018-09-25 DIAGNOSIS — N83202 Unspecified ovarian cyst, left side: Secondary | ICD-10-CM

## 2018-09-25 NOTE — Telephone Encounter (Signed)
Called patient and left message advising her that I would send her a mychart message.

## 2018-09-25 NOTE — Patient Instructions (Addendum)
YOU NEED TO HAVE A COVID 19 TEST ON___7/6____ @_______ , THIS TEST MUST BE DONE BEFORE SURGERY, COME TO [place] ENTRANCE. ONCE YOUR COVID TEST IS COMPLETED, PLEASE BEGIN THE QUARANTINE INSTRUCTIONS AS OUTLINED IN YOUR HANDOUT.                Heather Cole    Your procedure is scheduled on: 10-03-2018   Report to [facility] Main  Entrance  Report to admitting at 8:00 AM      Call this number if you have problems the morning of surgery [phone]     Remember: [place], NO [place].   Eat a light diet the day before surgery.  Examples including soups, broths, toast, yogurt, mashed potatoes.  Things to avoid include carbonated beverages (fizzy beverages), raw fruits and raw vegetables, or beans.   If your bowels are filled with gas, your surgeon will have difficulty visualizing your pelvic organs which increases your surgical risks.  CLEAR LIQUIDS FROM MIDNIGHT UNTIL 700 AM. NOTHING BY MOUTH AFTER 700 AM.  CLEAR LIQUID DIET   Foods Allowed                                                                     Foods Excluded  Coffee and tea, regular and decaf                             liquids that you cannot  Plain Jell-O in any flavor                                             see through such as: Fruit ices (not with fruit pulp)                                     milk, soups, orange juice  Iced Popsicles                                    All solid food   Cranberry, grape and apple juices Sports drinks like Gatorade Lightly seasoned clear broth or consume(fat free) Sugar, honey syrup  Sample Menu Breakfast                                Lunch                                     Supper Cranberry juice                    Beef broth                            Chicken broth Jell-O  Grape juice                           Apple  juice Coffee or tea                        Jell-O                                      Popsicle                                                Coffee or tea                        Coffee or tea  _____________________________________________________________________    Take these medicines the morning of surgery with A SIP OF WATER: Wellbutrin                               You may not have any metal on your body including hair pins and              piercings  Do not wear jewelry, make-up, lotions, powders or perfumes, deodorant             Do not wear nail polish.  Do not shave  48 hours prior to surgery.             Do not bring valuables to the hospital. [facility] IS NOT             RESPONSIBLE   FOR VALUABLES.  Contacts, dentures or bridgework may not be worn into surgery.      _____________________________________________________________________             [facility] Pre[facility] Before surgery, you can play an important role.   Because skin is not sterile, your skin needs to be as free of germs as possible.  You can reduce the number of germs on your skin by washing with CHG (chlorahexidine gluconate) soap before surgery.  CHG is an antiseptic cleaner which kills germs and bonds with the skin to continue killing germs even after washing. Please DO NOT use if you have an allergy to CHG or antibacterial soaps.   If your skin becomes reddened/irritated stop using the CHG and inform your nurse when you arrive at Short Stay. Do not shave (including legs and underarms) for at least 48 hours prior to the first CHG shower. Please follow these instructions carefully:  1.  Shower with CHG Soap the night before surgery and the  morning of Surgery.  2.  If you choose to wash your hair, wash your hair first as usual with your  normal  shampoo.  3.  After you shampoo, rinse your hair and body thoroughly to remove the  shampoo.                                         4.  Use CHG  as you would any other liquid soap.  You can apply chg directly  to the skin and wash                       Gently with a scrungie or clean washcloth.  5.  Apply the CHG Soap to your body ONLY FROM THE NECK DOWN.   Do not use on face/ open                           Wound or open sores. Avoid contact with eyes, ears mouth and genitals (private parts).                       Wash face,  Genitals (private parts) with your normal soap.             6.  Wash thoroughly, paying special attention to the area where your surgery  will be performed.  7.  Thoroughly rinse your body with warm water from the neck down.  8.  DO NOT shower/wash with your normal soap after using and rinsing off  the CHG Soap.             9.  Pat yourself dry with a clean towel.            10.  Wear clean pajamas.            11.  Place clean sheets on your bed the night of your first shower and do not  sleep with pets.  Day of Surgery : Do not apply any lotions/deodorants the morning of surgery.  Please wear clean clothes to the hospital/surgery center.   FAILURE TO FOLLOW THESE INSTRUCTIONS MAY RESULT IN THE CANCELLATION OF YOUR SURGERY PATIENT SIGNATURE_________________________________  NURSE SIGNATURE__________________________________  ________________________________________________________________________   Rogelia MireIncentive Spirometer  An incentive spirometer is a tool that can help keep your lungs clear and active. This tool measures how well you are filling your lungs with each breath. Taking long deep breaths may help reverse or decrease the chance of developing breathing (pulmonary) problems (especially infection) following:  A long period of time when you are unable to move or be active. BEFORE THE PROCEDURE   If the spirometer includes an indicator to show your best effort, your nurse or respiratory therapist will set it to a desired goal.  If possible, sit up straight or lean slightly forward. Try not to  slouch.  Hold the incentive spirometer in an upright position. INSTRUCTIONS FOR USE  1. Sit on the edge of your bed if possible, or sit up as far as you can in bed or on a chair. 2. Hold the incentive spirometer in an upright position. 3. Breathe out normally. 4. Place the mouthpiece in your mouth and seal your lips tightly around it. 5. Breathe in slowly and as deeply as possible, raising the piston or the ball toward the top of the column. 6. Hold your breath for 3-5 seconds or for as long as possible. Allow the piston or ball to fall to the bottom of the column. 7. Remove the mouthpiece from your mouth and breathe out normally. 8. Rest for a few seconds and repeat Steps 1 through 7 at least 10 times every 1-2 hours when you are awake. Take your time and take a few normal breaths between deep breaths. 9. The spirometer may include an indicator to show your best effort. Use the indicator as a goal to work toward during each repetition. 10. After each set  of 10 deep breaths, practice coughing to be sure your lungs are clear. If you have an incision (the cut made at the time of surgery), support your incision when coughing by placing a pillow or rolled up towels firmly against it. Once you are able to get out of bed, walk around indoors and cough well. You may stop using the incentive spirometer when instructed by your caregiver.  RISKS AND COMPLICATIONS  Take your time so you do not get dizzy or light-headed.  If you are in pain, you may need to take or ask for pain medication before doing incentive spirometry. It is harder to take a deep breath if you are having pain. AFTER USE  Rest and breathe slowly and easily.  It can be helpful to keep track of a log of your progress. Your caregiver can provide you with a simple table to help with this. If you are using the spirometer at home, follow these instructions: SEEK MEDICAL CARE IF:   You are having difficultly using the spirometer.  You  have trouble using the spirometer as often as instructed.  Your pain medication is not giving enough relief while using the spirometer.  You develop fever of 100.5 F (38.1 C) or higher. SEEK IMMEDIATE MEDICAL CARE IF:   You cough up bloody sputum that had not been present before.  You develop fever of 102 F (38.9 C) or greater.  You develop worsening pain at or near the incision site. MAKE SURE YOU:   Understand these instructions.  Will watch your condition.  Will get help right away if you are not doing well or get worse. Document Released: 07/24/2006 Document Revised: 06/05/2011 Document Reviewed: 09/24/2006 ExitCare Patient Information 2014 ExitCare, [place].   ________________________________________________________________________  WHAT IS A BLOOD TRANSFUSION? Blood Transfusion Information  A transfusion is the replacement of blood or some of its parts. Blood is made up of multiple cells which provide different functions.  Red blood cells carry oxygen and are used for blood loss replacement.  White blood cells fight against infection.  Platelets control bleeding.  Plasma helps clot blood.  Other blood products are available for specialized needs, such as hemophilia or other clotting disorders. BEFORE THE TRANSFUSION  Who gives blood for transfusions?   Healthy volunteers who are fully evaluated to make sure their blood is safe. This is blood bank blood. Transfusion therapy is the safest it has ever been in the practice of medicine. Before blood is taken from a donor, a complete history is taken to make sure that person has no history of diseases nor engages in risky social behavior (examples are intravenous drug use or sexual activity with multiple partners). The donor's travel history is screened to minimize risk of transmitting infections, such as malaria. The donated blood is tested for signs of infectious diseases, such as HIV and hepatitis. The blood is then  tested to be sure it is compatible with you in order to minimize the chance of a transfusion reaction. If you or a relative donates blood, this is often done in anticipation of surgery and is not appropriate for emergency situations. It takes many days to process the donated blood. RISKS AND COMPLICATIONS Although transfusion therapy is very safe and saves many lives, the main dangers of transfusion include:   Getting an infectious disease.  Developing a transfusion reaction. This is an allergic reaction to something in the blood you were given. Every precaution is taken to prevent this. The decision to have a  blood transfusion has been considered carefully by your caregiver before blood is given. Blood is not given unless the benefits outweigh the risks. AFTER THE TRANSFUSION  Right after receiving a blood transfusion, you will usually feel much better and more energetic. This is especially true if your red blood cells have gotten low (anemic). The transfusion raises the level of the red blood cells which carry oxygen, and this usually causes an energy increase.  The nurse administering the transfusion will monitor you carefully for complications. HOME CARE INSTRUCTIONS  No special instructions are needed after a transfusion. You may find your energy is better. Speak with your caregiver about any limitations on activity for underlying diseases you may have. SEEK MEDICAL CARE IF:   Your condition is not improving after your transfusion.  You develop redness or irritation at the intravenous (IV) site. SEEK IMMEDIATE MEDICAL CARE IF:  Any of the following symptoms occur over the next 12 hours:  Shaking chills.  You have a temperature by mouth above 102 F (38.9 C), not controlled by medicine.  Chest, back, or muscle pain.  People around you feel you are not acting correctly or are confused.  Shortness of breath or difficulty breathing.  Dizziness and fainting.  You get a rash or  develop hives.  You have a decrease in urine output.  Your urine turns a dark color or changes to pink, red, or [place]. Any of the following symptoms occur over the next 10 days:  You have a temperature by mouth above 102 F (38.9 C), not controlled by medicine.  Shortness of breath.  Weakness after normal activity.  The white part of the eye turns yellow (jaundice).  You have a decrease in the amount of urine or are urinating less often.  Your urine turns a dark color or changes to pink, red, or [place]. Document Released: 03/10/2000 Document Revised: 06/05/2011 Document Reviewed: 10/28/2007 [facility] Patient Information 2014 [place].  _______________________________________________________________________

## 2018-09-27 ENCOUNTER — Encounter (HOSPITAL_COMMUNITY): Payer: Self-pay | Admitting: Certified Registered"

## 2018-09-30 ENCOUNTER — Other Ambulatory Visit: Payer: Self-pay

## 2018-09-30 ENCOUNTER — Encounter (HOSPITAL_COMMUNITY): Payer: Self-pay

## 2018-09-30 ENCOUNTER — Encounter (HOSPITAL_COMMUNITY)
Admission: RE | Admit: 2018-09-30 | Discharge: 2018-09-30 | Disposition: A | Discharge status: Home / Self Care | Payer: 59 | Source: Ambulatory Visit | Attending: Gynecologic Oncology | Admitting: Gynecologic Oncology

## 2018-09-30 ENCOUNTER — Ambulatory Visit (HOSPITAL_COMMUNITY): Payer: 59

## 2018-09-30 DIAGNOSIS — R971 Elevated cancer antigen 125 [CA 125]: Secondary | ICD-10-CM | POA: Diagnosis not present

## 2018-09-30 DIAGNOSIS — Z01818 Encounter for other preprocedural examination: Secondary | ICD-10-CM | POA: Diagnosis not present

## 2018-09-30 DIAGNOSIS — Z1159 Encounter for screening for other viral diseases: Secondary | ICD-10-CM | POA: Diagnosis not present

## 2018-09-30 DIAGNOSIS — N83202 Unspecified ovarian cyst, left side: Secondary | ICD-10-CM | POA: Diagnosis not present

## 2018-09-30 LAB — COMPREHENSIVE METABOLIC PANEL
ALT: 17 U/L (ref 0–44)
AST: 23 U/L (ref 15–41)
Albumin: 4.6 g/dL (ref 3.5–5.0)
Alkaline Phosphatase: 60 U/L (ref 38–126)
Anion gap: 9 (ref 5–15)
BUN: 16 mg/dL (ref 6–20)
CO2: 24 mmol/L (ref 22–32)
Calcium: 9.5 mg/dL (ref 8.9–10.3)
Chloride: 106 mmol/L (ref 98–111)
Creatinine, Ser: 0.68 mg/dL (ref 0.44–1.00)
GFR calc Af Amer: >60 mL/min (ref >60)
GFR calc non Af Amer: >60 mL/min (ref >60)
Glucose, Bld: 77 mg/dL (ref 70–99)
Potassium: 3.8 mmol/L (ref 3.5–5.1)
Sodium: 139 mmol/L (ref 135–145)
Total Bilirubin: 0.8 mg/dL (ref 0.3–1.2)
Total Protein: 7.9 g/dL (ref 6.5–8.1)

## 2018-09-30 LAB — URINALYSIS, ROUTINE W REFLEX MICROSCOPIC
Bilirubin Urine: NEGATIVE
Glucose, UA: NEGATIVE mg/dL
Hgb urine dipstick: NEGATIVE
Ketones, ur: NEGATIVE mg/dL
Leukocytes,Ua: NEGATIVE
Nitrite: NEGATIVE
Protein, ur: NEGATIVE mg/dL
Specific Gravity, Urine: 1.027 (ref 1.005–1.030)
pH: 5 (ref 5.0–8.0)

## 2018-09-30 LAB — CBC
HCT: 37.4 % (ref 36.0–46.0)
Hemoglobin: 12.2 g/dL (ref 12.0–15.0)
MCH: 33 pg (ref 26.0–34.0)
MCHC: 32.6 g/dL (ref 30.0–36.0)
MCV: 101.1 fL — ABNORMAL HIGH (ref 80.0–100.0)
Platelets: 211 10*3/uL (ref 150–400)
RBC: 3.7 MIL/uL — ABNORMAL LOW (ref 3.87–5.11)
RDW: 12.3 % (ref 11.5–15.5)
WBC: 7.3 10*3/uL (ref 4.0–10.5)
nRBC: 0 % (ref 0.0–0.2)

## 2018-10-01 ENCOUNTER — Other Ambulatory Visit (HOSPITAL_COMMUNITY)
Admission: RE | Admit: 2018-10-01 | Discharge: 2018-10-01 | Disposition: A | Discharge status: Home / Self Care | Payer: 59 | Source: Ambulatory Visit | Attending: Gynecologic Oncology | Admitting: Gynecologic Oncology

## 2018-10-01 ENCOUNTER — Other Ambulatory Visit: Payer: Self-pay | Admitting: Gynecologic Oncology

## 2018-10-01 ENCOUNTER — Encounter (HOSPITAL_COMMUNITY): Payer: Self-pay

## 2018-10-01 ENCOUNTER — Ambulatory Visit (HOSPITAL_COMMUNITY)
Admission: RE | Admit: 2018-10-01 | Discharge: 2018-10-01 | Disposition: A | Discharge status: Home / Self Care | Payer: 59 | Source: Ambulatory Visit | Attending: Gynecologic Oncology | Admitting: Gynecologic Oncology

## 2018-10-01 DIAGNOSIS — Z1159 Encounter for screening for other viral diseases: Secondary | ICD-10-CM | POA: Insufficient documentation

## 2018-10-01 DIAGNOSIS — N83202 Unspecified ovarian cyst, left side: Secondary | ICD-10-CM | POA: Diagnosis not present

## 2018-10-01 DIAGNOSIS — R971 Elevated cancer antigen 125 [CA 125]: Secondary | ICD-10-CM | POA: Diagnosis not present

## 2018-10-01 DIAGNOSIS — N838 Other noninflammatory disorders of ovary, fallopian tube and broad ligament: Secondary | ICD-10-CM | POA: Diagnosis not present

## 2018-10-01 DIAGNOSIS — Z01818 Encounter for other preprocedural examination: Secondary | ICD-10-CM | POA: Diagnosis not present

## 2018-10-01 LAB — SARS CORONAVIRUS 2 (TAT 6-24 HRS): SARS Coronavirus 2: NEGATIVE

## 2018-10-01 MED ORDER — SENNOSIDES-DOCUSATE SODIUM 8.6-50 MG PO TABS: 2.0000 | ORAL_TABLET | Freq: Every day | ORAL | 1 refills | Status: DC

## 2018-10-01 MED ORDER — OXYCODONE HCL 5 MG PO TABS: 5.0000 mg | ORAL_TABLET | ORAL | 0 refills | Status: DC | PRN

## 2018-10-01 MED ORDER — IBUPROFEN 800 MG PO TABS: 800.0000 mg | ORAL_TABLET | Freq: Three times a day (TID) | ORAL | 0 refills | Status: DC | PRN

## 2018-10-01 MED ORDER — IOHEXOL 300 MG/ML  SOLN
100.0000 mL | Freq: Once | INTRAMUSCULAR | Status: AC | PRN
  Administered 2018-10-01: 100 mL via INTRAVENOUS

## 2018-10-01 MED ORDER — SODIUM CHLORIDE (PF) 0.9 % IJ SOLN
INTRAMUSCULAR | Status: AC
  Filled 2018-10-01: qty 50

## 2018-10-01 MED FILL — oxyCODONE HCL 5 MG TABS: 5 | 3 days supply | Qty: 15 | Fill #0

## 2018-10-01 MED FILL — IBUPROFEN 800 MG TABS: 800 | 10 days supply | Qty: 30 | Fill #0

## 2018-10-01 NOTE — Progress Notes (Signed)
Pre-op meds prescribed. 

## 2018-10-02 ENCOUNTER — Encounter: Payer: Self-pay | Admitting: Gynecologic Oncology

## 2018-10-02 ENCOUNTER — Telehealth: Payer: Self-pay | Admitting: Gynecologic Oncology

## 2018-10-03 ENCOUNTER — Telehealth: Payer: Self-pay | Admitting: Gynecologic Oncology

## 2018-10-03 ENCOUNTER — Ambulatory Visit (HOSPITAL_COMMUNITY): Admission: RE | Admit: 2018-10-03 | Payer: 59 | Source: Home / Self Care | Admitting: Gynecologic Oncology

## 2018-10-03 ENCOUNTER — Encounter (HOSPITAL_COMMUNITY): Admission: RE | Payer: Self-pay | Source: Home / Self Care

## 2018-10-03 SURGERY — SALPINGO-OOPHORECTOMY, ROBOT-ASSISTED
Anesthesia: General | Laterality: Left

## 2018-10-03 NOTE — Telephone Encounter (Signed)
Called to inform patient of new surgery date of July 14 and to see if she had any questions. Advised her to please call the office for any questions or concerns.

## 2018-10-03 NOTE — Telephone Encounter (Signed)
Called patient to discuss surgery.

## 2018-10-03 NOTE — Progress Notes (Addendum)
SPOKE WITH PATIENT BY PHONE AND MADE AWARE NEW SURGERY DATE 10-08-2018, NPO AFTER MIDNIGHT FOOD, CLEAR LIQUIDS UNTIL 1245 PM, THEN NPO, ARRIVE 1345 PM 10-08-18 [facility] ADMITTING COVID TEST SCHEDULED 10-04-2018 AT 1035

## 2018-10-04 ENCOUNTER — Other Ambulatory Visit (HOSPITAL_COMMUNITY)
Admission: RE | Admit: 2018-10-04 | Discharge: 2018-10-04 | Disposition: A | Discharge status: Home / Self Care | Payer: 59 | Source: Ambulatory Visit | Attending: Gynecologic Oncology | Admitting: Gynecologic Oncology

## 2018-10-04 DIAGNOSIS — Z1159 Encounter for screening for other viral diseases: Secondary | ICD-10-CM | POA: Insufficient documentation

## 2018-10-04 DIAGNOSIS — Z01812 Encounter for preprocedural laboratory examination: Secondary | ICD-10-CM | POA: Insufficient documentation

## 2018-10-04 LAB — SARS CORONAVIRUS 2 (TAT 6-24 HRS): SARS Coronavirus 2: NEGATIVE

## 2018-10-08 ENCOUNTER — Encounter (HOSPITAL_COMMUNITY): Payer: Self-pay | Admitting: *Deleted

## 2018-10-08 ENCOUNTER — Encounter (HOSPITAL_COMMUNITY)
Admission: RE | Disposition: A | Discharge status: Home / Self Care | Payer: Self-pay | Source: Home / Self Care | Attending: Gynecologic Oncology

## 2018-10-08 ENCOUNTER — Ambulatory Visit (HOSPITAL_COMMUNITY): Payer: 59 | Admitting: Anesthesiology

## 2018-10-08 ENCOUNTER — Ambulatory Visit (HOSPITAL_COMMUNITY)
Admission: RE | Admit: 2018-10-08 | Discharge: 2018-10-08 | Disposition: A | Discharge status: Home / Self Care | Payer: 59 | Attending: Gynecologic Oncology | Admitting: Gynecologic Oncology

## 2018-10-08 DIAGNOSIS — R8569 Abnormal cytological findings in specimens from other digestive organs and abdominal cavity: Secondary | ICD-10-CM | POA: Diagnosis not present

## 2018-10-08 DIAGNOSIS — N801 Endometriosis of ovary: Secondary | ICD-10-CM | POA: Diagnosis not present

## 2018-10-08 DIAGNOSIS — N803 Endometriosis of pelvic peritoneum: Secondary | ICD-10-CM | POA: Diagnosis not present

## 2018-10-08 DIAGNOSIS — Z803 Family history of malignant neoplasm of breast: Secondary | ICD-10-CM | POA: Diagnosis not present

## 2018-10-08 DIAGNOSIS — N8312 Corpus luteum cyst of left ovary: Secondary | ICD-10-CM | POA: Insufficient documentation

## 2018-10-08 DIAGNOSIS — D500 Iron deficiency anemia secondary to blood loss (chronic): Secondary | ICD-10-CM | POA: Diagnosis not present

## 2018-10-08 DIAGNOSIS — N83202 Unspecified ovarian cyst, left side: Secondary | ICD-10-CM

## 2018-10-08 DIAGNOSIS — R971 Elevated cancer antigen 125 [CA 125]: Secondary | ICD-10-CM

## 2018-10-08 DIAGNOSIS — G43909 Migraine, unspecified, not intractable, without status migrainosus: Secondary | ICD-10-CM | POA: Diagnosis not present

## 2018-10-08 DIAGNOSIS — N809 Endometriosis, unspecified: Secondary | ICD-10-CM | POA: Diagnosis not present

## 2018-10-08 HISTORY — PX: ROBOTIC ASSISTED SALPINGO OOPHERECTOMY: SHX6082

## 2018-10-08 LAB — TYPE AND SCREEN
ABO/RH(D): A POS
Antibody Screen: NEGATIVE

## 2018-10-08 LAB — PREGNANCY, URINE: Preg Test, Ur: NEGATIVE

## 2018-10-08 SURGERY — SALPINGO-OOPHORECTOMY, ROBOT-ASSISTED
Anesthesia: General | Laterality: Left

## 2018-10-08 MED ORDER — FENTANYL CITRATE (PF) 100 MCG/2ML IJ SOLN: 25.0000 ug | INTRAMUSCULAR | Status: DC | PRN

## 2018-10-08 MED ORDER — ACETAMINOPHEN 650 MG RE SUPP
650.0000 mg | RECTAL | Status: DC | PRN
  Filled 2018-10-08: qty 1

## 2018-10-08 MED ORDER — ACETAMINOPHEN 500 MG PO TABS
1000.0000 mg | ORAL_TABLET | ORAL | Status: AC
  Administered 2018-10-08: 1000 mg via ORAL
  Filled 2018-10-08: qty 2

## 2018-10-08 MED ORDER — OXYCODONE HCL 5 MG PO TABS: 5.0000 mg | ORAL_TABLET | Freq: Once | ORAL | Status: DC | PRN

## 2018-10-08 MED ORDER — OXYCODONE HCL 5 MG/5ML PO SOLN: 5.0000 mg | Freq: Once | ORAL | Status: DC | PRN

## 2018-10-08 MED ORDER — CEFAZOLIN SODIUM-DEXTROSE 2-4 GM/100ML-% IV SOLN
2.0000 g | INTRAVENOUS | Status: AC
  Administered 2018-10-08: 2 g via INTRAVENOUS
  Filled 2018-10-08: qty 100

## 2018-10-08 MED ORDER — SCOPOLAMINE 1 MG/3DAYS TD PT72
1.0000 | MEDICATED_PATCH | TRANSDERMAL | Status: DC
  Administered 2018-10-08: 1.5 mg via TRANSDERMAL
  Filled 2018-10-08: qty 1

## 2018-10-08 MED ORDER — ENOXAPARIN SODIUM 40 MG/0.4ML ~~LOC~~ SOLN
40.0000 mg | SUBCUTANEOUS | Status: AC
  Administered 2018-10-08: 40 mg via SUBCUTANEOUS
  Filled 2018-10-08: qty 0.4

## 2018-10-08 MED ORDER — ONDANSETRON HCL 4 MG/2ML IJ SOLN
INTRAMUSCULAR | Status: DC | PRN
  Administered 2018-10-08: 4 mg via INTRAVENOUS

## 2018-10-08 MED ORDER — DEXAMETHASONE SODIUM PHOSPHATE 4 MG/ML IJ SOLN: 4.0000 mg | INTRAMUSCULAR | Status: DC

## 2018-10-08 MED ORDER — LACTATED RINGERS IR SOLN
Status: DC | PRN
  Administered 2018-10-08: 1000 mL

## 2018-10-08 MED ORDER — ROCURONIUM BROMIDE 10 MG/ML (PF) SYRINGE
PREFILLED_SYRINGE | INTRAVENOUS | Status: DC | PRN
  Administered 2018-10-08: 20 mg via INTRAVENOUS
  Administered 2018-10-08: 50 mg via INTRAVENOUS

## 2018-10-08 MED ORDER — KETOROLAC TROMETHAMINE 30 MG/ML IJ SOLN
INTRAMUSCULAR | Status: AC
  Filled 2018-10-08: qty 1

## 2018-10-08 MED ORDER — OXYCODONE HCL 5 MG PO TABS: 5.0000 mg | ORAL_TABLET | ORAL | Status: DC | PRN

## 2018-10-08 MED ORDER — BUPIVACAINE HCL (PF) 0.25 % IJ SOLN
INTRAMUSCULAR | Status: AC
  Filled 2018-10-08: qty 30

## 2018-10-08 MED ORDER — STERILE WATER FOR IRRIGATION IR SOLN
Status: DC | PRN
  Administered 2018-10-08: 1000 mL

## 2018-10-08 MED ORDER — PROPOFOL 10 MG/ML IV BOLUS
INTRAVENOUS | Status: AC
  Filled 2018-10-08: qty 20

## 2018-10-08 MED ORDER — ACETAMINOPHEN 325 MG PO TABS: 650.0000 mg | ORAL_TABLET | ORAL | Status: DC | PRN

## 2018-10-08 MED ORDER — BUPIVACAINE HCL (PF) 0.25 % IJ SOLN
INTRAMUSCULAR | Status: DC | PRN
  Administered 2018-10-08: 23 mL

## 2018-10-08 MED ORDER — SODIUM CHLORIDE 0.9% FLUSH: 3.0000 mL | Freq: Two times a day (BID) | INTRAVENOUS | Status: DC

## 2018-10-08 MED ORDER — FENTANYL CITRATE (PF) 250 MCG/5ML IJ SOLN
INTRAMUSCULAR | Status: AC
  Filled 2018-10-08: qty 5

## 2018-10-08 MED ORDER — GABAPENTIN 300 MG PO CAPS
300.0000 mg | ORAL_CAPSULE | ORAL | Status: AC
  Administered 2018-10-08: 300 mg via ORAL
  Filled 2018-10-08: qty 1

## 2018-10-08 MED ORDER — LIDOCAINE 2% (20 MG/ML) 5 ML SYRINGE
INTRAMUSCULAR | Status: DC | PRN
  Administered 2018-10-08: 60 mg via INTRAVENOUS

## 2018-10-08 MED ORDER — LIDOCAINE 2% (20 MG/ML) 5 ML SYRINGE
INTRAMUSCULAR | Status: AC
  Filled 2018-10-08: qty 5

## 2018-10-08 MED ORDER — FENTANYL CITRATE (PF) 250 MCG/5ML IJ SOLN
INTRAMUSCULAR | Status: DC | PRN
  Administered 2018-10-08 (×4): 50 ug via INTRAVENOUS

## 2018-10-08 MED ORDER — LACTATED RINGERS IV SOLN
INTRAVENOUS | Status: DC
  Administered 2018-10-08 (×2): via INTRAVENOUS

## 2018-10-08 MED ORDER — KETOROLAC TROMETHAMINE 30 MG/ML IJ SOLN
30.0000 mg | Freq: Four times a day (QID) | INTRAMUSCULAR | Status: DC
  Administered 2018-10-08: 30 mg via INTRAVENOUS

## 2018-10-08 MED ORDER — SUGAMMADEX SODIUM 200 MG/2ML IV SOLN
INTRAVENOUS | Status: DC | PRN
  Administered 2018-10-08: 140 mg via INTRAVENOUS

## 2018-10-08 MED ORDER — MIDAZOLAM HCL 2 MG/2ML IJ SOLN
INTRAMUSCULAR | Status: AC
  Filled 2018-10-08: qty 2

## 2018-10-08 MED ORDER — PROPOFOL 10 MG/ML IV BOLUS
INTRAVENOUS | Status: DC | PRN
  Administered 2018-10-08: 150 mg via INTRAVENOUS

## 2018-10-08 MED ORDER — SODIUM CHLORIDE 0.9% FLUSH: 3.0000 mL | INTRAVENOUS | Status: DC | PRN

## 2018-10-08 MED ORDER — SODIUM CHLORIDE 0.9 % IV SOLN: 250.0000 mL | INTRAVENOUS | Status: DC | PRN

## 2018-10-08 MED ORDER — PROMETHAZINE HCL 25 MG/ML IJ SOLN: 6.2500 mg | INTRAMUSCULAR | Status: DC | PRN

## 2018-10-08 MED ORDER — MIDAZOLAM HCL 5 MG/5ML IJ SOLN
INTRAMUSCULAR | Status: DC | PRN
  Administered 2018-10-08: 2 mg via INTRAVENOUS

## 2018-10-08 MED ORDER — DEXAMETHASONE SODIUM PHOSPHATE 10 MG/ML IJ SOLN
INTRAMUSCULAR | Status: DC | PRN
  Administered 2018-10-08: 4 mg via INTRAVENOUS

## 2018-10-08 MED ORDER — ROCURONIUM BROMIDE 10 MG/ML (PF) SYRINGE
PREFILLED_SYRINGE | INTRAVENOUS | Status: AC
  Filled 2018-10-08: qty 10

## 2018-10-08 MED ORDER — ONDANSETRON HCL 4 MG/2ML IJ SOLN
INTRAMUSCULAR | Status: AC
  Filled 2018-10-08: qty 2

## 2018-10-08 MED ORDER — MORPHINE SULFATE (PF) 4 MG/ML IV SOLN: 2.0000 mg | INTRAVENOUS | Status: DC | PRN

## 2018-10-08 SURGICAL SUPPLY — 54 items
APPLICATOR SURGIFLO ENDO (HEMOSTASIS) IMPLANT
BAG LAPAROSCOPIC 12 15 PORT 16 (BASKET) IMPLANT
BAG RETRIEVAL 12/15 (BASKET)
COVER BACK TABLE 60X90IN (DRAPES) ×2 IMPLANT
COVER TIP SHEARS 8 DVNC (MISCELLANEOUS) ×1 IMPLANT
COVER TIP SHEARS 8MM DA VINCI (MISCELLANEOUS) ×1
COVER WAND RF STERILE (DRAPES) IMPLANT
DECANTER SPIKE VIAL GLASS SM (MISCELLANEOUS) ×1 IMPLANT
DERMABOND ADVANCED (GAUZE/BANDAGES/DRESSINGS) ×1
DERMABOND ADVANCED .7 DNX12 (GAUZE/BANDAGES/DRESSINGS) ×1 IMPLANT
DRAIN CHANNEL RND F F (WOUND CARE) IMPLANT
DRAPE ARM DVNC X/XI (DISPOSABLE) ×4 IMPLANT
DRAPE COLUMN DVNC XI (DISPOSABLE) ×1 IMPLANT
DRAPE DA VINCI XI ARM (DISPOSABLE) ×4
DRAPE DA VINCI XI COLUMN (DISPOSABLE) ×1
DRAPE SHEET LG 3/4 BI-LAMINATE (DRAPES) ×2 IMPLANT
DRAPE SURG IRRIG POUCH 19X23 (DRAPES) ×2 IMPLANT
ELECT REM PT RETURN 15FT ADLT (MISCELLANEOUS) ×2 IMPLANT
GAUZE 4X4 16PLY RFD (DISPOSABLE) IMPLANT
GLOVE BIO SURGEON STRL SZ 6 (GLOVE) ×8 IMPLANT
GLOVE BIO SURGEON STRL SZ 6.5 (GLOVE) ×2 IMPLANT
GOWN STRL REUS W/ TWL LRG LVL3 (GOWN DISPOSABLE) ×4 IMPLANT
GOWN STRL REUS W/TWL LRG LVL3 (GOWN DISPOSABLE) ×4
HOLDER FOLEY CATH W/STRAP (MISCELLANEOUS) ×2 IMPLANT
IRRIG SUCT STRYKERFLOW 2 WTIP (MISCELLANEOUS) ×2
IRRIGATION SUCT STRKRFLW 2 WTP (MISCELLANEOUS) ×1 IMPLANT
KIT PROCEDURE DA VINCI SI (MISCELLANEOUS)
KIT PROCEDURE DVNC SI (MISCELLANEOUS) IMPLANT
KIT TURNOVER KIT A (KITS) IMPLANT
MANIPULATOR UTERINE 4.5 ZUMI (MISCELLANEOUS) ×2 IMPLANT
NDL SPNL 18GX3.5 QUINCKE PK (NEEDLE) IMPLANT
NEEDLE HYPO 22GX1.5 SAFETY (NEEDLE) ×1 IMPLANT
NEEDLE SPNL 18GX3.5 QUINCKE PK (NEEDLE) IMPLANT
OBTURATOR OPTICAL STANDARD 8MM (TROCAR) ×1
OBTURATOR OPTICAL STND 8 DVNC (TROCAR) ×1
OBTURATOR OPTICALSTD 8 DVNC (TROCAR) ×1 IMPLANT
PACK ROBOT GYN CUSTOM WL (TRAY / TRAY PROCEDURE) ×2 IMPLANT
PAD POSITIONING PINK XL (MISCELLANEOUS) ×2 IMPLANT
PORT ACCESS TROCAR AIRSEAL 12 (TROCAR) ×1 IMPLANT
PORT ACCESS TROCAR AIRSEAL 5M (TROCAR) ×1
POUCH SPECIMEN RETRIEVAL 10MM (ENDOMECHANICALS) IMPLANT
SEAL CANN UNIV 5-8 DVNC XI (MISCELLANEOUS) ×3 IMPLANT
SEAL XI 5MM-8MM UNIVERSAL (MISCELLANEOUS) ×3
SET TRI-LUMEN FLTR TB AIRSEAL (TUBING) ×2 IMPLANT
SURGIFLO W/THROMBIN 8M KIT (HEMOSTASIS) IMPLANT
SUT VIC AB 0 CT1 27 (SUTURE)
SUT VIC AB 0 CT1 27XBRD ANTBC (SUTURE) IMPLANT
SUT VIC AB 4-0 PS2 18 (SUTURE) ×4 IMPLANT
SYR 10ML LL (SYRINGE) IMPLANT
TOWEL OR NON WOVEN STRL DISP B (DISPOSABLE) ×2 IMPLANT
TRAP SPECIMEN MUCOUS 40CC (MISCELLANEOUS) IMPLANT
TRAY FOLEY MTR SLVR 16FR STAT (SET/KITS/TRAYS/PACK) ×2 IMPLANT
UNDERPAD 30X30 (UNDERPADS AND DIAPERS) ×2 IMPLANT
WATER STERILE IRR 1000ML POUR (IV SOLUTION) ×2 IMPLANT

## 2018-10-08 NOTE — Op Note (Signed)
OPERATIVE NOTE  Date: 10/08/18  Preoperative Diagnosis: left ovarian complex cyst, elevated CA 125   Postoperative Diagnosis:  Endometriosis of left and right ovary and right uterosacral ligament  Procedure(s) Performed: Robotic-assisted laparoscopic left salpingo-oophorectomy  Surgeon: Everitt Amber, M.D.  Assistant Surgeon: Lahoma Crocker M.D. (an MD assistant was necessary for tissue manipulation, management of robotic instrumentation, retraction and positioning due to the complexity of the case and hospital policies).   Anesthesia: Gen. endotracheal.  Specimens: left tube and ovary, right utero-sacral peritoneum, pelvic washings  Estimated Blood Loss: 10 mL. Blood Replacement: None  Complications: none  Indication for Procedure:  The patient has a history of a 5cm complex left ovarian cyst and an elevated CA 125.  Operative Findings: normal upper abdomen, appendix, omentum. No ascites. A 5cm left ovarian cyst was present. Contained old blood. There were powderburn lesions on posterior right cul de sac/uterosacral ligament peritoneum with nodularity (biopsied) and powderburn lesions on the right ovary (cauterized).   Frozen pathology was consistent with endometrioma.   Procedure: The patient's taken to the operating room and placed under general endotracheal anesthesia testing difficulty. She is placed in a dorsolithotomy position and cervical acromial pad was placed. The arms were tucked with care taken to pad the olecranon process. And prepped and draped in usual sterile fashion. A uterine manipulator (zumi) was placed vaginally. A 70mm incision was made in the left upper quadrant palmer's point and a 5 mm Optiview trocar used to enter the abdomen under direct visualization. With entry into the abdomen and then maintenance of 15 mm of mercury the patient was placed in Trendelenburg position. An incision was made in the umbilicus and a 69mm trochar was placed through this site. Two  incisions were made lateral to the umbilical incision in the left and right abdomen measuring 1mm. These incisions were made approximately 10 cm lateral to the umbilical incision. 8 mm robotic trochars were inserted. The robot was docked.  The abdomen was inspected as was the pelvis.  Pelvic washings were obtained.  The left peritoneum and the side wall was incised, and the retroperitoneal space entered. The left ureter was identified and the left pararectal space was developed. The utero-ovarian ligament was skeletonized cauterized and transected. The left utero-ovarian ligaments were cauterized and transected in the left adnexa was placed in an Endo Catch bag.    The right ovary was inspected. It contained a corpus luteum. There were 2 powderburn lesions consistent with endometriosis on the surface of the ovary. These were cauterized.   There was nodularity and powderburn lesions on the peritoneum of the right uterosacral ligament. This was resected and sent for permanent pathology.   The abdomen was copiously irrigated and drained and all operative sites inspected and hemostasis was assured  The robot was undocked.The contents of the left Endo Catch bag were first aspirated and then morcellated to facilitate removal from the abdominal cavity through the ;eft upper quadrant incision.  The ports were all removed. The fascial closure at the umbilical incision and left upper quadrant port was made with 0 Vicryl.  All incisions were closed with a running subcuticular Monocryl suture. Dermabond was applied. Sponge, lap and needle counts were correct x 3.    The patient had sequential compression devices for VTE prophylaxis.         Disposition: PACU -stable         Condition: stable  Donaciano Eva, MD

## 2018-10-08 NOTE — Transfer of Care (Signed)
Immediate Anesthesia Transfer of Care Note  Patient: Heather Cole  Procedure(s) Performed: XI ROBOTIC ASSISTED LEFT SALPINGO OOPHORECTOMY (Left )  Patient Location: PACU  Anesthesia Type:General  Level of Consciousness: drowsy and patient cooperative  Airway & Oxygen Therapy: Patient Spontanous Breathing and Patient connected to face mask oxygen  Post-op Assessment: Report given to RN and Post -op Vital signs reviewed and stable  Post vital signs: Reviewed and stable  Last Vitals:  Vitals Value Taken Time  BP 131/75 10/08/18 1847  Temp 36.3 C 10/08/18 1847  Pulse 80 10/08/18 1848  Resp 9 10/08/18 1848  SpO2 100 % 10/08/18 1848  Vitals shown include unvalidated device data.  Last Pain:  Vitals:   10/08/18 1404  TempSrc: Oral         Complications: No apparent anesthesia complications

## 2018-10-08 NOTE — Anesthesia Procedure Notes (Signed)
Procedure Name: Intubation Date/Time: 10/08/2018 5:02 PM Performed by: Brennan Bailey, MD Pre-anesthesia Checklist: Patient identified, Emergency Drugs available, Suction available and Patient being monitored Patient Re-evaluated:Patient Re-evaluated prior to induction Oxygen Delivery Method: Circle System Utilized Preoxygenation: Pre-oxygenation with 100% oxygen Induction Type: IV induction Ventilation: Mask ventilation without difficulty Grade View: Grade I Tube type: Oral Tube size: 7.0 mm Number of attempts: 2 Airway Equipment and Method: Stylet Placement Confirmation: ETT inserted through vocal cords under direct vision,  positive ETCO2 and breath sounds checked- equal and bilateral Secured at: 21 cm Tube secured with: Tape Dental Injury: Teeth and Oropharynx as per pre-operative assessment  Comments: First attempt by CRNA with Mac #3 blade, esophageal intubation, immediately recognized and ETT removed. Second attempt by myself with Sabra Heck #2 blade, grade 1 view. Mask ventilated between attempts with SpO2>95% throughout. Daiva Huge, MD

## 2018-10-08 NOTE — Anesthesia Preprocedure Evaluation (Addendum)
Anesthesia Evaluation  Patient identified by MRN, date of birth, ID band Patient awake    Reviewed: Allergy & Precautions, NPO status , Patient's Chart, lab work & pertinent test results  History of Anesthesia Complications Negative for: history of anesthetic complications  Airway Mallampati: II  TM Distance: >3 FB Neck ROM: Full    Dental no notable dental hx.    Pulmonary neg pulmonary ROS,    Pulmonary exam normal        Cardiovascular negative cardio ROS Normal cardiovascular exam     Neuro/Psych  Headaches,    GI/Hepatic negative GI ROS, Neg liver ROS,   Endo/Other  negative endocrine ROS  Renal/GU negative Renal ROS     Musculoskeletal negative musculoskeletal ROS (+)   Abdominal   Peds  Hematology negative hematology ROS (+)   Anesthesia Other Findings Day of surgery medications reviewed with the patient.  Reproductive/Obstetrics Ovarian cyst                            Anesthesia Physical Anesthesia Plan  ASA: II  Anesthesia Plan: General   Post-op Pain Management:    Induction: Intravenous  PONV Risk Score and Plan: 4 or greater and Treatment may vary due to age or medical condition, Ondansetron, Dexamethasone, Midazolam and Scopolamine patch - Pre-op  Airway Management Planned: Oral ETT  Additional Equipment:   Intra-op Plan:   Post-operative Plan: Extubation in OR  Informed Consent: I have reviewed the patients History and Physical, chart, labs and discussed the procedure including the risks, benefits and alternatives for the proposed anesthesia with the patient or authorized representative who has indicated his/her understanding and acceptance.     Dental advisory given  Plan Discussed with: CRNA  Anesthesia Plan Comments:         Anesthesia Quick Evaluation

## 2018-10-08 NOTE — Interval H&P Note (Signed)
History and Physical Interval Note:  10/08/2018 4:10 PM  Heather Cole  has presented today for surgery, with the diagnosis of LEFT OVARIAN CYST, ELEVATED CA 125.  The various methods of treatment have been discussed with the patient and family. After consideration of risks, benefits and other options for treatment, the patient has consented to  Procedure(s): XI ROBOTIC ASSISTED LEFT SALPINGO OOPHORECTOMY, POSSIBLE STAGING, POSSIBLE LYMPH NODE DISSECTION, POSSIBLE OMENTECTOMY (Left) as a surgical intervention.  The patient's history has been reviewed, patient examined, no change in status, stable for surgery.  I have reviewed the patient's chart and labs.  Questions were answered to the patient's satisfaction.     Thereasa Solo

## 2018-10-08 NOTE — Discharge Instructions (Signed)
No cancer was found in the ovary on frozen section. It looked most consistent with endometriosis.  Dr Oliver Humossi's office will let you know if there is anything different that is resulted on final pathology. She removed the left tube and ovary and biopsied some endometriosis appearing tissue overlying the right utero-sacral ligament. There was no visible endometriosis remaining at the end of the procedure.   Return to work: 4 weeks (2 weeks with physical restrictions).  Activity: 1. Be up and out of the bed during the day.  Take a nap if needed.  You may walk up steps but be careful and use the hand rail.  Stair climbing will tire you more than you think, you may need to stop part way and rest.   2. No lifting or straining for 4 weeks.  3. No driving for 1 weeks.  Do Not drive if you are taking narcotic pain medicine.  4. Shower daily.  Use soap and water on your incision and pat dry; don't rub.   5. No sexual activity and nothing in the vagina for 8 weeks.  Medications:  - Take ibuprofen and tylenol first line for pain control. Take these regularly (every 6 hours) to decrease the build up of pain.  - If necessary, for severe pain not relieved by ibuprofen, contact Dr Oliver Humossi's office and you will be prescribed percocet.  - While taking percocet you should take sennakot every night to reduce the likelihood of constipation. If this causes diarrhea, stop its use.  Diet: 1. Low sodium Heart Healthy Diet is recommended.  2. It is safe to use a laxative if you have difficulty moving your bowels.   Wound Care: 1. Keep clean and dry.  Shower daily.  Reasons to call the Doctor:   Fever - Oral temperature greater than 100.4 degrees Fahrenheit  Foul-smelling vaginal discharge  Difficulty urinating  Nausea and vomiting  Increased pain at the site of the incision that is unrelieved with pain medicine.  Difficulty breathing with or without chest pain  New calf pain especially if only on  one side  Sudden, continuing increased vaginal bleeding with or without clots.   Follow-up: 1. See Heather Cole in 4 weeks.  Contacts: For questions or concerns you should contact:  Dr. Adolphus BirchwoodEmma Cole at [phone] After hours and on week-ends call [phone] and ask to speak to the physician on call for Gynecologic Oncology   Unilateral Salpingo-Oophorectomy, Care After This sheet gives you information about how to care for yourself after your procedure. Your health care provider may also give you more specific instructions. If you have problems or questions, contact your health care provider. What can I expect after the procedure? After the procedure, it is common to have:  Abdominal pain.  Some occasional vaginal bleeding (spotting).  Tiredness. Follow these instructions at home: Incision care   Keep your incision area and your bandage (dressing) clean and dry.  Follow instructions from your health care provider about how to take care of your incision. Make sure you: ? Wash your hands with soap and water before you change your dressing. If soap and water are not available, use hand sanitizer. ? Change your dressing as told by your health care provider. ? Leave stitches (sutures), staples, skin glue, or adhesive strips in place. These skin closures may need to stay in place for 2 weeks or longer. If adhesive strip edges start to loosen and curl up, you may trim the loose edges. Do not  remove adhesive strips completely unless your health care provider tells you to do that.  Check your incision area every day for signs of infection. Check for: ? Redness, swelling, or pain. ? Fluid or blood. ? Warmth. ? Pus or a bad smell. Activity  Do not drive or use heavy machinery while taking prescription pain medicine.  Do not drive for 24 hours if you received a medicine to help you relax (sedative).  Take frequent, short walks throughout the day. Rest when you get tired. Ask your  health care provider what activities are safe for you.  Avoid activities that require great effort. Also, avoid heavy lifting. Do not lift anything that is heavier than 5 lb (2.3 kg), or the limit that your health care provider tells you, until he or she says that it is safe to do so.  Do not douche, use tampons, or have sex until your health care provider approves. General instructions  To prevent or treat constipation while you are taking prescription pain medicine, your health care provider may recommend that you: ? Drink enough fluid to keep your urine pale yellow. ? Take over-the-counter or prescription medicines. ? Eat foods that are high in fiber, such as fresh fruits and vegetables, whole grains, and beans. ? Limit foods that are high in fat and processed sugars, such as fried and sweet foods.  Take over-the-counter and prescription medicines only as told by your health care provider.  Do not take baths, swim, or use a hot tub until your health care provider approves. Ask your health care provider if you may take showers. You may only be allowed to take sponge baths.  Wear compression stockings as told by your health care provider. These stockings help to prevent blood clots and reduce swelling in your legs.  Keep all follow-up visits as told by your health care provider. This is important. Contact a health care provider if:  You have pain when you urinate.  You have pus or a bad smelling discharge coming from your vagina.  You have redness, swelling, or pain around your incision.  You have fluid or blood coming from your incision.  Your incision feels warm to the touch.  You have pus or a bad smell coming from your incision.  You have a fever.  Your incision starts to break open.  You have abdominal pain that gets worse or does not get better with medicine.  You develop a rash.  You develop nausea and vomiting.  You feel lightheaded. Get help right away  if:  You develop pain in your chest or leg.  You develop shortness of breath.  You faint.  You have increased bleeding from your vagina. Summary  After the procedure, it is common to have pain, tiredness, and occasional bleeding from the vagina.  Follow instructions from your health care provider about how to take care of your incision.  Check your incision every day for signs of infection and report any symptoms to your health care provider.  Follow instructions from your health care provider about activities and restrictions. This information is not intended to replace advice given to you by your health care provider. Make sure you discuss any questions you have with your health care provider. Document Released: 01/07/2009 Document Revised: 02/23/2017 Document Reviewed: 06/22/2016 Elsevier Patient Education  2020 [employer]    General Anesthesia, Adult, Care After This sheet gives you information about how to care for yourself after your procedure. Your health care provider may  also give you more specific instructions. If you have problems or questions, contact your health care provider. What can I expect after the procedure? After the procedure, the following side effects are common:  Pain or discomfort at the IV site.  Nausea.  Vomiting.  Sore throat.  Trouble concentrating.  Feeling cold or chills.  Weak or tired.  Sleepiness and fatigue.  Soreness and body aches. These side effects can affect parts of the body that were not involved in surgery. Follow these instructions at home:  For at least 24 hours after the procedure:  Have a responsible adult stay with you. It is important to have someone help care for you until you are awake and alert.  Rest as needed.  Do not: ? Participate in activities in which you could fall or become injured. ? Drive. ? Use heavy machinery. ? Drink alcohol. ? Take sleeping pills or medicines that cause drowsiness. ? Make  important decisions or sign legal documents. ? Take care of children on your own. Eating and drinking  Follow any instructions from your health care provider about eating or drinking restrictions.  When you feel hungry, start by eating small amounts of foods that are soft and easy to digest (bland), such as toast. Gradually return to your regular diet.  Drink enough fluid to keep your urine pale yellow.  If you vomit, rehydrate by drinking water, juice, or clear broth. General instructions  If you have sleep apnea, surgery and certain medicines can increase your risk for breathing problems. Follow instructions from your health care provider about wearing your sleep device: ? Anytime you are sleeping, including during daytime naps. ? While taking prescription pain medicines, sleeping medicines, or medicines that make you drowsy.  Return to your normal activities as told by your health care provider. Ask your health care provider what activities are safe for you.  Take over-the-counter and prescription medicines only as told by your health care provider.  If you smoke, do not smoke without supervision.  Keep all follow-up visits as told by your health care provider. This is important. Contact a health care provider if:  You have nausea or vomiting that does not get better with medicine.  You cannot eat or drink without vomiting.  You have pain that does not get better with medicine.  You are unable to pass urine.  You develop a skin rash.  You have a fever.  You have redness around your IV site that gets worse. Get help right away if:  You have difficulty breathing.  You have chest pain.  You have blood in your urine or stool, or you vomit blood. Summary  After the procedure, it is common to have a sore throat or nausea. It is also common to feel tired.  Have a responsible adult stay with you for the first 24 hours after general anesthesia. It is important to have  someone help care for you until you are awake and alert.  When you feel hungry, start by eating small amounts of foods that are soft and easy to digest (bland), such as toast. Gradually return to your regular diet.  Drink enough fluid to keep your urine pale yellow.  Return to your normal activities as told by your health care provider. Ask your health care provider what activities are safe for you. This information is not intended to replace advice given to you by your health care provider. Make sure you discuss any questions you have with your health  care provider. Document Released: 06/19/2000 Document Revised: 03/16/2017 Document Reviewed: 10/27/2016 Elsevier Patient Education  2020 [employer].

## 2018-10-09 ENCOUNTER — Telehealth: Payer: Self-pay

## 2018-10-09 ENCOUNTER — Encounter (HOSPITAL_COMMUNITY): Payer: Self-pay | Admitting: Gynecologic Oncology

## 2018-10-09 LAB — ABO/RH: ABO/RH(D): A POS

## 2018-10-09 NOTE — Telephone Encounter (Signed)
Ms Taha states that she is doing well after her surgery. Incisions are dry and intact. Currently using the ibuprofen for pain control. Eating and drinking well. She has moved her bowels. Urinating well. Pt has phone number [phone] to call if she has any questions or concerns prior to her post op visit on 11-01-18.

## 2018-10-10 NOTE — Anesthesia Postprocedure Evaluation (Signed)
Anesthesia Post Note  Patient: Heather Cole  Procedure(s) Performed: XI ROBOTIC ASSISTED LEFT SALPINGO OOPHORECTOMY (Left )     Patient location during evaluation: PACU Anesthesia Type: General Level of consciousness: awake and alert Pain management: pain level controlled Vital Signs Assessment: post-procedure vital signs reviewed and stable Respiratory status: spontaneous breathing, nonlabored ventilation and respiratory function stable Cardiovascular status: blood pressure returned to baseline and stable Postop Assessment: no apparent nausea or vomiting Anesthetic complications: no    Last Vitals:  Vitals:   10/08/18 1945 10/08/18 2000  BP: (!) 143/88 (!) 142/87  Pulse: (!) 55 60  Resp: 16 14  Temp: 36.6 C 36.7 C  SpO2: 99% 98%    Last Pain:  Vitals:   10/08/18 2000  TempSrc:   PainSc: 0-No pain   Pain Goal:                   Brennan Bailey

## 2018-10-15 ENCOUNTER — Telehealth: Payer: Self-pay

## 2018-10-15 NOTE — Telephone Encounter (Signed)
Told Ms Elenbaas that the pathology showed endometriosis no malignancy per Joylene John, NP.

## 2018-10-23 ENCOUNTER — Ambulatory Visit: Payer: 59 | Admitting: Gynecologic Oncology

## 2018-11-01 ENCOUNTER — Encounter: Payer: Self-pay | Admitting: Gynecologic Oncology

## 2018-11-01 ENCOUNTER — Inpatient Hospital Stay: Payer: 59 | Attending: Gynecologic Oncology | Admitting: Gynecologic Oncology

## 2018-11-01 ENCOUNTER — Other Ambulatory Visit: Payer: Self-pay

## 2018-11-01 ENCOUNTER — Ambulatory Visit: Payer: 59 | Admitting: Gynecologic Oncology

## 2018-11-01 VITALS — BP 127/80 | HR 68 | Temp 98.3°F | Resp 18 | Ht 65.0 in | Wt 156.6 lb

## 2018-11-01 DIAGNOSIS — N80109 Endometriosis of ovary, unspecified side, unspecified depth: Secondary | ICD-10-CM

## 2018-11-01 DIAGNOSIS — Z90721 Acquired absence of ovaries, unilateral: Secondary | ICD-10-CM | POA: Insufficient documentation

## 2018-11-01 DIAGNOSIS — N801 Endometriosis of ovary: Secondary | ICD-10-CM | POA: Diagnosis not present

## 2018-11-01 NOTE — Progress Notes (Signed)
Follow-up Note: Gyn-Onc  Consult was requested by Heather Cole. Heather Cole Cole for the evaluation of Heather Cole Cole 43 y.o. female  CC:  Chief Complaint  Patient presents with  . Endometriosis    follow-up     Assessment/Plan:  Heather Cole. Dillon Cole  is a 43 y.o.  year old with a history of a left ovarian endometrioma, s/p left salpingo-oophorectomy on 10/08/18.   She is doing well postop.  We discussed how endometriomas can impact fertility.  I discussed that she may have improved spontaneous fertility after this procedure.  However that fertility cannot be guaranteed in the setting of underlying endometriosis.  I discussed that she should consider consultation with reproductive endocrinology if starting family strongly desired.  The patient has taken birth control pills in the past.  I discussed that this can be therapeutic for endometriosis should she not desire fertility at this time.  She will discuss her endometriosis further with Heather Cole. Heather Cole Cole at subsequent gynecologic consultations.  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  The patient's history began when she developed a spontaneous abortion in February 2020 after a natural conception.  She has a history of anemia and planned an ultrasound evaluation to ensure that she does not have uterine fibroids.  A transvaginal ultrasound scan was performed on June 12, 2018 which revealed on the left ovary a complex hemorrhagic appearing cyst with peripheral blood flow measuring 2 2 x 1.8 cm, and a 2.2 x 2 x 2 cm solid-appearing lesion.  There was no increased blood flow.  The right ovary contained a solid-appearing lesion measuring 1.4 cm.  There was no increased blood flow.  No free fluid was seen.  A plan was made for repeat ultrasound scan 6 weeks later.  Ca1 25 drawn on Aug 01, 2018 was normal at 29.1.  A repeat ultrasound scan was  performed on Jul 30, 2018 which revealed a left ovary containing a complex cyst about 3 cm that was mostly fluid-filled with slightly irregular borders.  There was also soft tissue nodule in the left ovary measuring about 1 x 2 cm with no internal blood flow.  The previous ovarian complex cyst had been about 2 cm with a possible solid structure that was slightly larger than the soft tissue nodule that was noted in the subsequent May ultrasound.  The right ovary contained a complex cyst measuring 2 cm with peripheral internal vascularity that was possibly hemorrhagic cyst and appeared new from the prior ultrasound.  She developed another spontaneous miscarriage in May 2020.  She returned for a subsequent follow-up ultrasound scan and Ca1 25 6 weeks after this May ultrasound scan.  It was performed on September 10, 2018.  This revealed that the right complex cyst was again noted but smaller measuring 1.6 cm.  The left ovary has look appears to be a 3 separate complex cystic areas.  One measured 4 x 3.3 x 2.9 cm and has a multicystic appearance either cystadenoma or hemorrhagic versus other etiology, a 4.2 x 1.8 x 4 cm complex debris-filled cyst with irregular borders, and a 3.2 x 2.1 x 2.5 cm complex cyst with internal debris and soft tissue nodular area.  No vascularity was noted.  hCG was drawn and was normal per patient.   Ca1 25 was drawn on that same day September 10, 2018 and had increased to 60.2.  The patient  reports no abdominal symptoms including no bloating, early satiety or abdominal distention.  She denies dyspareunia.  She does report cyclical pelvic pain mostly mittelschmerz pain with ovulation.  She has some history of menorrhagia but otherwise regular menses still.  She is attempting to conceive with her partner for desired pregnancy.  She has no history of a full-term pregnancy.  She has a history of abnormal Pap with negative high-risk HPV in 2018.  Medical history is otherwise unremarkable.  She  has a family history significant for an aunt with premenopausal breast cancer.  She is never had abdominal surgeries.  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  Interval hx:  On July 14 she underwent a robotic assisted left salpingo-oophorectomy.  Intraoperative findings were significant for a 5 cm left ovarian cyst.  It contained no blood.  There were powder burn lesions on the posterior right cul-de-sac, uterosacral ligament perineal peritoneum with nodularity, and powder burn lesions on the right ovary that were cauterized.  At the completion of the procedure there were no visible endometriosis lesions present.  Frozen section pathology was consistent with an endometrioma.  Final pathology confirmed a benign endometrioma of the left ovary.  There were other foci of endometriosis on the left ovary.  No malignancy was seen.  The soft tissue biopsy from the right uterosacral peritoneum also showed endometriosis.  Since surgery she is done well with no major complaints.  She has resumed most normal activities including work, running, and other activities of daily living.  Current Meds:  Outpatient Encounter Medications as of 11/01/2018  Medication Sig  . buPROPion (WELLBUTRIN XL) 150 MG 24 hr tablet Take 1 tablet (150 mg total) by mouth daily.  Marland Kitchen. EPINEPHrine (EPIPEN 2-PAK) 0.3 mg/0.3 mL IJ SOAJ injection Inject 0.3 mg into the muscle as needed for anaphylaxis.  Marland Kitchen. ibuprofen (ADVIL) 800 MG tablet Take 1 tablet (800 mg total) by mouth every 8 (eight) hours as needed. For AFTER surgery  . oxyCODONE (OXY IR/ROXICODONE) 5 MG immediate release tablet Take 1 tablet (5 mg total) by mouth every 4 (four) hours as needed for severe pain. For AFTER surgery, do not take and drive  . senna-docusate (SENOKOT-S) 8.6-50 MG tablet Take 2 tablets by mouth at bedtime. For AFTER surgery, do not take if having diarrhea  . SUMAtriptan (IMITREX) 100 MG tablet Take 0.5 tablets (50 mg total) by mouth every 2 (two) hours as  needed for migraine. May repeat in 2 hours if headache persists or recurs.   No facility-administered encounter medications on file as of 11/01/2018.     Allergy:  Allergies  Allergen Reactions  . Bee Venom Anaphylaxis  . Sulfa Antibiotics Itching    Social Hx:   Social History   Socioeconomic History  . Marital status: Single    Spouse name: Not on file  . Number of children: Not on file  . Years of education: Not on file  . Highest education level: Not on file  Occupational History  . Not on file  Social Needs  . Financial resource strain: Not on file  . Food insecurity    Worry: Not on file    Inability: Not on file  . Transportation needs    Medical: Not on file    Non-medical: Not on file  Tobacco Use  . Smoking status: Never Smoker  . Smokeless tobacco: Never Used  Substance and Sexual Activity  . Alcohol use: Yes    Comment: occasional drink  . Drug use:  No  . Sexual activity: Yes  Lifestyle  . Physical activity    Days per week: Not on file    Minutes per session: Not on file  . Stress: Not on file  Relationships  . Social Musicianconnections    Talks on phone: Not on file    Gets together: Not on file    Attends religious service: Not on file    Active member of club or organization: Not on file    Attends meetings of clubs or organizations: Not on file    Relationship status: Not on file  . Intimate partner violence    Fear of current or ex partner: Not on file    Emotionally abused: Not on file    Physically abused: Not on file    Forced sexual activity: Not on file  Other Topics Concern  . Not on file  Social History Narrative  . Not on file    Past Surgical Hx:  Past Surgical History:  Procedure Laterality Date  . ROBOTIC ASSISTED SALPINGO OOPHERECTOMY Left 10/08/2018   Procedure: XI ROBOTIC ASSISTED LEFT SALPINGO OOPHORECTOMY;  Surgeon: Adolphus Birchwoodossi, Khaalid Lefkowitz, MD;  Location: [facility];  Service: Gynecology;  Laterality: Left;    Past Medical Hx:  Past  Medical History:  Diagnosis Date  . Anemia   . History of spontaneous abortion    (2) 7 week Miscarriages last 04-2018  . Migraines     Past Gynecological History:  No LMP recorded.  Family Hx:  Family History  Problem Relation Age of Onset  . Lymphoma Paternal Grandmother   . Breast cancer Paternal Aunt     Review of Systems:  Constitutional  Feels well,   ENT Normal appearing ears and nares bilaterally Skin/Breast  No rash, sores, jaundice, itching, dryness Cardiovascular  No chest pain, shortness of breath, or edema  Pulmonary  No cough or wheeze.  Gastro Intestinal  No nausea, vomitting, or diarrhoea. No bright red blood per rectum, no abdominal pain, change in bowel movement, or constipation.  Genito Urinary  No frequency, urgency, dysuria, no irregular bleeding Musculo Skeletal  No myalgia, arthralgia, joint swelling or pain  Neurologic  No weakness, numbness, change in gait,  Psychology  No depression, anxiety, insomnia.   Vitals:  Blood pressure 127/80, pulse 68, temperature 98.3 F (36.8 C), temperature source Oral, resp. rate 18, height 5\' 5"  (1.651 m), weight 156 lb 9.6 oz (71 kg).  Physical Exam: WD in NAD Neck  Supple NROM, without any enlargements.  Lymph Node Survey No cervical supraclavicular or inguinal adenopathy Cardiovascular  Pulse normal rate, regularity and rhythm. S1 and S2 normal.  Lungs  Clear to auscultation bilateraly, without wheezes/crackles/rhonchi. Good air movement.  Skin  No rash/lesions/breakdown  Psychiatry  Alert and oriented to person, place, and time  Abdomen  Normoactive bowel sounds, abdomen soft, non-tender and nonobese without evidence of hernia. Well healed incisions.  Back No CVA tenderness Genito Urinary  deferred  Extremities  No bilateral cyanosis, clubbing or edema.   Luisa DagoEmma C Louvenia Golomb, MD  11/01/2018, 4:59 PM

## 2018-11-01 NOTE — Patient Instructions (Addendum)
The left ovary contained an endometrioma. If your cycles become painful or you develop new pelvic complaints I recommend ultrasound to evaluate for new endometriomas, and consideration of continuous birth control pills or a mirena IUD to control the disease if fertility is not an issue.   Dr Denman George can be reached at [phone].

## 2018-11-28 ENCOUNTER — Other Ambulatory Visit: Payer: Self-pay | Admitting: Family Medicine

## 2018-11-28 DIAGNOSIS — F320 Major depressive disorder, single episode, mild: Secondary | ICD-10-CM

## 2018-12-04 MED ORDER — BUPROPION HCL ER (XL) 150 MG PO TB24: 150.0000 mg | ORAL_TABLET | Freq: Every day | ORAL | 0 refills | Status: DC

## 2018-12-04 MED FILL — buPROPion HCL ER (XL) 150 M: 150 | 90 days supply | Qty: 90 | Fill #0

## 2019-01-01 ENCOUNTER — Other Ambulatory Visit: Payer: Self-pay

## 2019-01-01 DIAGNOSIS — Z20822 Contact with and (suspected) exposure to covid-19: Secondary | ICD-10-CM

## 2019-01-01 DIAGNOSIS — Z20828 Contact with and (suspected) exposure to other viral communicable diseases: Secondary | ICD-10-CM | POA: Diagnosis not present

## 2019-01-03 LAB — NOVEL CORONAVIRUS, NAA: SARS-CoV-2, NAA: NOT DETECTED

## 2019-01-13 ENCOUNTER — Other Ambulatory Visit: Payer: Self-pay

## 2019-01-13 ENCOUNTER — Telehealth (INDEPENDENT_AMBULATORY_CARE_PROVIDER_SITE_OTHER): Payer: 59 | Admitting: Family Medicine

## 2019-01-13 ENCOUNTER — Encounter: Payer: Self-pay | Admitting: Family Medicine

## 2019-01-13 DIAGNOSIS — F320 Major depressive disorder, single episode, mild: Secondary | ICD-10-CM | POA: Diagnosis not present

## 2019-01-13 MED ORDER — BUPROPION HCL ER (XL) 300 MG PO TB24: 300.0000 mg | ORAL_TABLET | Freq: Every day | ORAL | 3 refills | Status: DC

## 2019-01-13 NOTE — Progress Notes (Signed)
Telemedicine Encounter- SOAP NOTE Established Patient  This telephone encounter was conducted with the patient's (or proxy's) verbal consent via audio telecommunications: yes/no: Yes Patient was instructed to have this encounter in a suitably private space; and to only have persons present to whom they give permission to participate. In addition, patient identity was confirmed by use of name plus two identifiers (DOB and address).  I discussed the limitations, risks, security and privacy concerns of performing an evaluation and management service by telephone and the availability of in person appointments. I also discussed with the patient that there may be a patient responsible charge related to this service. The patient expressed understanding and agreed to proceed.  I spent a total of TIME; 0 MIN TO 60 MIN: 15 minutes talking with the patient or their proxy.  Chief Complaint  Patient presents with  . Depression    needs increase wellbutrin    Subjective   Heather Cole is a 43 y.o. established patient. Telephone visit today for  HPI  She is on wellbutrin 300mg  based on her own judgement of increasing the dose She states that she was able to tolerate 300mg   She initially had appetite suppression but that resolved She reports that it helped her  Mood more than the 150mg   She is exercising and feels overall better. No sleep disturbances  Depression screen [facility] 2/9 01/13/2019 01/13/2019 07/23/2018 06/05/2017 12/01/2016  Decreased Interest 0 0 1 0 2  Down, Depressed, Hopeless 0 0 1 0 2  PHQ - 2 Score 0 0 2 0 4  Altered sleeping - - 0 - 2  Tired, decreased energy - - 1 - 2  Change in appetite - - 0 - 2  Feeling bad or failure about yourself  - - 0 - 2  Trouble concentrating - - 0 - 0  Moving slowly or fidgety/restless - - 2 - 0  Suicidal thoughts - - 0 - 0  PHQ-9 Score - - 5 - 12  Difficult doing work/chores - - Somewhat difficult - Somewhat difficult     Patient Active  Problem List   Diagnosis Date Noted  . Left ovarian cyst 10/08/2018  . Elevated CA-125 10/08/2018  . History of systemic reaction to bee sting 04/09/2016  . Migraine without aura and without status migrainosus, not intractable 04/09/2016  . Iron deficiency anemia due to chronic blood loss 04/09/2016  . Menorrhagia with regular cycle 04/09/2016    Past Medical History:  Diagnosis Date  . Anemia   . History of spontaneous abortion    (2) 7 week Miscarriages last 04-2018  . Migraines     Current Outpatient Medications  Medication Sig Dispense Refill  . buPROPion (WELLBUTRIN XL) 300 MG 24 hr tablet Take 1 tablet (300 mg total) by mouth daily. 90 tablet 3  . EPINEPHrine (EPIPEN 2-PAK) 0.3 mg/0.3 mL IJ SOAJ injection Inject 0.3 mg into the muscle as needed for anaphylaxis.    . SUMAtriptan (IMITREX) 100 MG tablet Take 0.5 tablets (50 mg total) by mouth every 2 (two) hours as needed for migraine. May repeat in 2 hours if headache persists or recurs. 10 tablet 2   No current facility-administered medications for this visit.     Allergies  Allergen Reactions  . Bee Venom Anaphylaxis  . Sulfa Antibiotics Itching    Social History   Socioeconomic History  . Marital status: Single    Spouse name: Not on file  . Number of children: Not on file  .  Years of education: Not on file  . Highest education level: Not on file  Occupational History  . Not on file  Social Needs  . Financial resource strain: Not on file  . Food insecurity    Worry: Not on file    Inability: Not on file  . Transportation needs    Medical: Not on file    Non-medical: Not on file  Tobacco Use  . Smoking status: Never Smoker  . Smokeless tobacco: Never Used  Substance and Sexual Activity  . Alcohol use: Yes    Comment: occasional drink  . Drug use: No  . Sexual activity: Yes  Lifestyle  . Physical activity    Days per week: Not on file    Minutes per session: Not on file  . Stress: Not on file   Relationships  . Social Musician on phone: Not on file    Gets together: Not on file    Attends religious service: Not on file    Active member of club or organization: Not on file    Attends meetings of clubs or organizations: Not on file    Relationship status: Not on file  . Intimate partner violence    Fear of current or ex partner: Not on file    Emotionally abused: Not on file    Physically abused: Not on file    Forced sexual activity: Not on file  Other Topics Concern  . Not on file  Social History Narrative  . Not on file    ROS Review of Systems  Constitutional: Negative for activity change, appetite change, chills and fever.  HENT: Negative for congestion, nosebleeds, trouble swallowing and voice change.   Respiratory: Negative for cough, shortness of breath and wheezing.   Gastrointestinal: Negative for diarrhea, nausea and vomiting.  Genitourinary: Negative for difficulty urinating, dysuria, flank pain and hematuria.  Musculoskeletal: Negative for back pain, joint swelling and neck pain.  Neurological: Negative for dizziness, speech difficulty, light-headedness and numbness.  See HPI. All other review of systems negative.   Objective   Vitals as reported by the patient: Today's Vitals   01/13/19 0916  Weight: 150 lb (68 kg)  Height: 5\' 5"  (1.651 m)    Ferris was seen today for depression.  Diagnoses and all orders for this visit:  Depression, major, single episode, mild (HCC) -     buPROPion (WELLBUTRIN XL) 300 MG 24 hr tablet; Take 1 tablet (300 mg total) by mouth daily.   Refilled wellbutrin at higher dose (which patient had titrated on her own)  Pt advised to follow up prn   I discussed the assessment and treatment plan with the patient. The patient was provided an opportunity to ask questions and all were answered. The patient agreed with the plan and demonstrated an understanding of the instructions.   The patient was advised to  call back or seek an in-person evaluation if the symptoms worsen or if the condition fails to improve as anticipated.  I provided 15 minutes of non-face-to-face time during this encounter.  , MD  Primary [facility] at [facility]

## 2019-01-15 MED FILL — buPROPion HCL ER (XL) 300 M: 300 | 90 days supply | Qty: 90 | Fill #0

## 2019-01-29 ENCOUNTER — Other Ambulatory Visit: Payer: Self-pay

## 2019-01-29 DIAGNOSIS — Z20822 Contact with and (suspected) exposure to covid-19: Secondary | ICD-10-CM

## 2019-01-31 LAB — NOVEL CORONAVIRUS, NAA: SARS-CoV-2, NAA: NOT DETECTED

## 2019-02-18 ENCOUNTER — Encounter: Payer: Self-pay | Admitting: Gynecologic Oncology

## 2019-03-24 DIAGNOSIS — Z03818 Encounter for observation for suspected exposure to other biological agents ruled out: Secondary | ICD-10-CM | POA: Diagnosis not present

## 2019-04-09 MED FILL — BUPROPION HCL ER (XL) 300 M: 300 | 90 days supply | Qty: 90 | Fill #1

## 2019-04-30 MED FILL — SUMATRIPTAN SUCC 50 MG TAB: 50 | 30 days supply | Qty: 9 | Fill #1

## 2019-04-30 MED FILL — LARIN FE 1.5-30 TABLET: 1.5-30 | 28 days supply | Qty: 28 | Fill #1

## 2019-07-17 MED FILL — BUPROPION HCL ER (XL) 300 M: 300 | 90 days supply | Qty: 90 | Fill #2

## 2019-10-28 MED FILL — BUPROPION HCL ER (XL) 300 M: 300 | 90 days supply | Qty: 90 | Fill #3

## 2019-11-25 ENCOUNTER — Other Ambulatory Visit (HOSPITAL_COMMUNITY): Payer: Self-pay | Admitting: Obstetrics and Gynecology

## 2019-11-25 DIAGNOSIS — Z6826 Body mass index (BMI) 26.0-26.9, adult: Secondary | ICD-10-CM | POA: Diagnosis not present

## 2019-11-25 DIAGNOSIS — Z01419 Encounter for gynecological examination (general) (routine) without abnormal findings: Secondary | ICD-10-CM | POA: Diagnosis not present

## 2019-11-25 DIAGNOSIS — Z1151 Encounter for screening for human papillomavirus (HPV): Secondary | ICD-10-CM | POA: Diagnosis not present

## 2019-11-25 MED FILL — SUMAtriptan SUCCINATE 50 MG: 50 | 30 days supply | Qty: 9 | Fill #0

## 2019-12-31 ENCOUNTER — Other Ambulatory Visit: Payer: Self-pay

## 2019-12-31 ENCOUNTER — Ambulatory Visit (INDEPENDENT_AMBULATORY_CARE_PROVIDER_SITE_OTHER): Payer: 59

## 2019-12-31 DIAGNOSIS — Z23 Encounter for immunization: Secondary | ICD-10-CM

## 2020-01-28 DIAGNOSIS — Z319 Encounter for procreative management, unspecified: Secondary | ICD-10-CM | POA: Diagnosis not present

## 2020-01-28 DIAGNOSIS — N96 Recurrent pregnancy loss: Secondary | ICD-10-CM | POA: Diagnosis not present

## 2020-02-05 DIAGNOSIS — N96 Recurrent pregnancy loss: Secondary | ICD-10-CM | POA: Diagnosis not present

## 2020-02-05 DIAGNOSIS — Z3141 Encounter for fertility testing: Secondary | ICD-10-CM | POA: Diagnosis not present

## 2020-02-05 DIAGNOSIS — Z3202 Encounter for pregnancy test, result negative: Secondary | ICD-10-CM | POA: Diagnosis not present

## 2020-03-02 DIAGNOSIS — H5213 Myopia, bilateral: Secondary | ICD-10-CM | POA: Diagnosis not present

## 2020-03-10 DIAGNOSIS — Z319 Encounter for procreative management, unspecified: Secondary | ICD-10-CM | POA: Diagnosis not present

## 2020-03-10 DIAGNOSIS — N96 Recurrent pregnancy loss: Secondary | ICD-10-CM | POA: Diagnosis not present

## 2020-03-15 IMAGING — CT CT ABDOMEN AND PELVIS WITH CONTRAST
2 of 5 series · 16 of 46 positions shown, 18 images · IV contrast (OMNIPAQUE)
Comparison: None.

CLINICAL DATA: Pelvic mass. Left adnexal complex cystic lesion on
recent ultrasound. Elevated CA 125 level.

EXAM:
CT ABDOMEN AND PELVIS WITH CONTRAST
TECHNIQUE: Multidetector CT imaging of the abdomen and pelvis was performed
using the standard protocol following bolus administration of
intravenous contrast.
CONTRAST:  100mL OMNIPAQUE IOHEXOL 300 MG/ML  SOLN

[Series 2: axial st · axial · 0.71mm/px · z∈[-629,-254]mm · 13 of 87 slices shown, 15 images]
[im 6/87  soft-tissue]
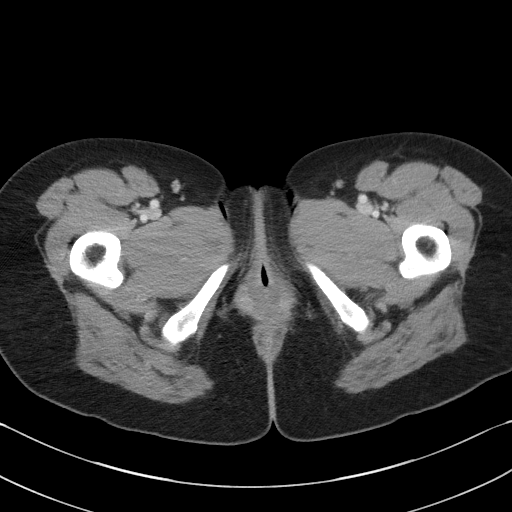
[im 6/87  bone]
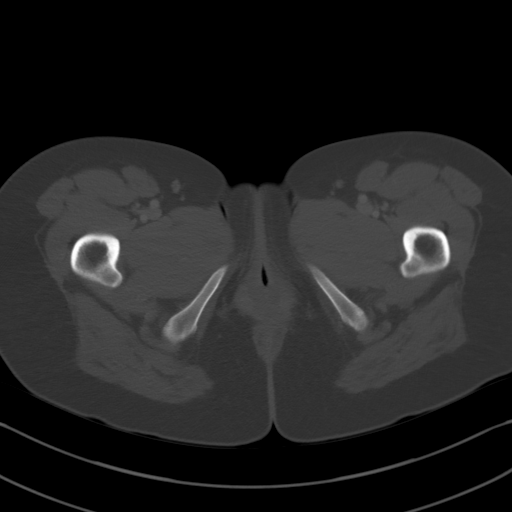
[im 11/87  soft-tissue]
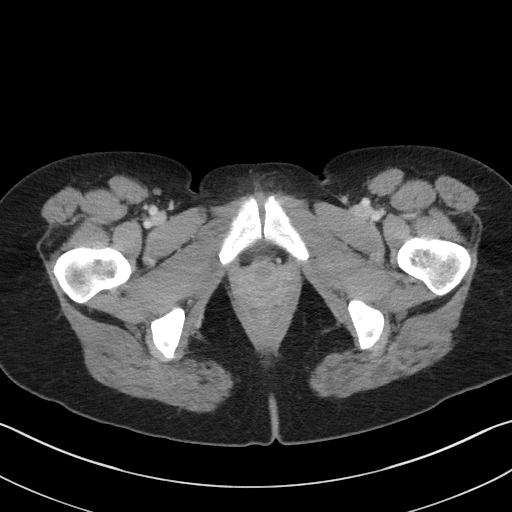
[im 17/87  soft-tissue]
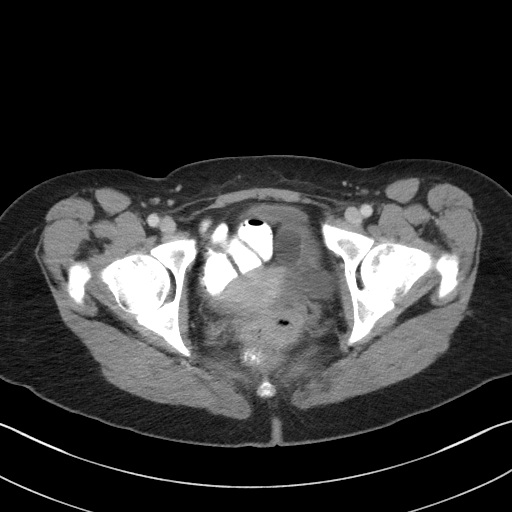
[im 27/87  soft-tissue]
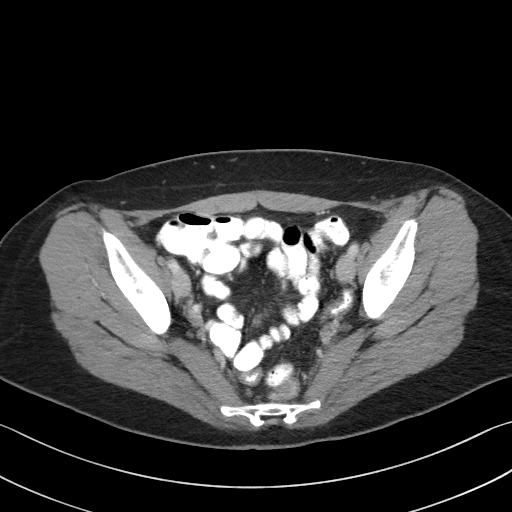
[im 33/87  soft-tissue]
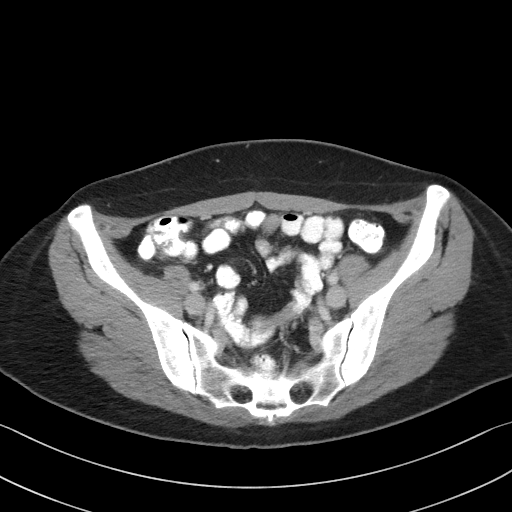
[im 38/87  soft-tissue]
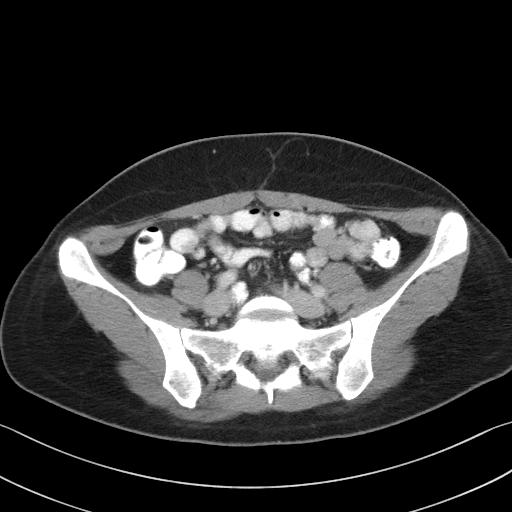
[im 44/87  soft-tissue]
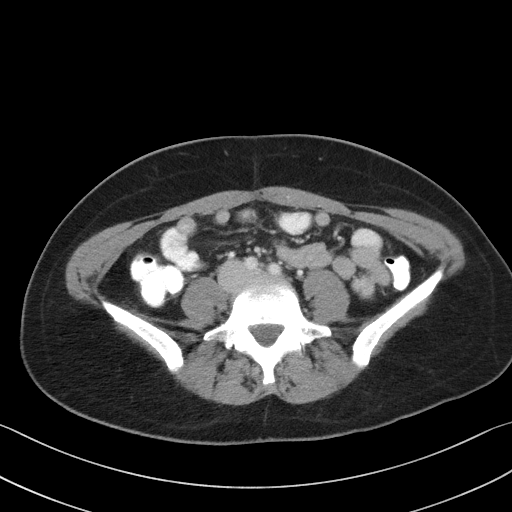
[im 49/87  soft-tissue]
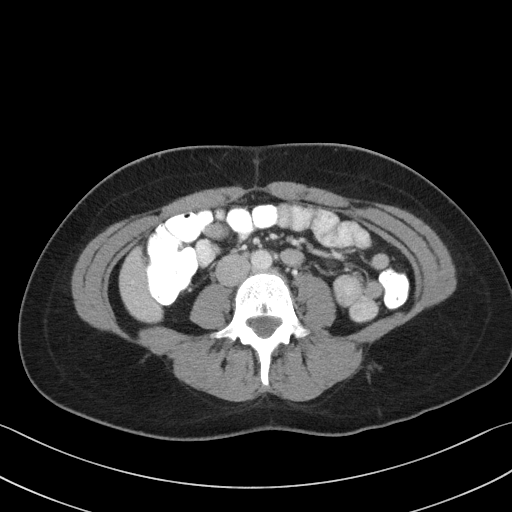
[im 54/87  soft-tissue]
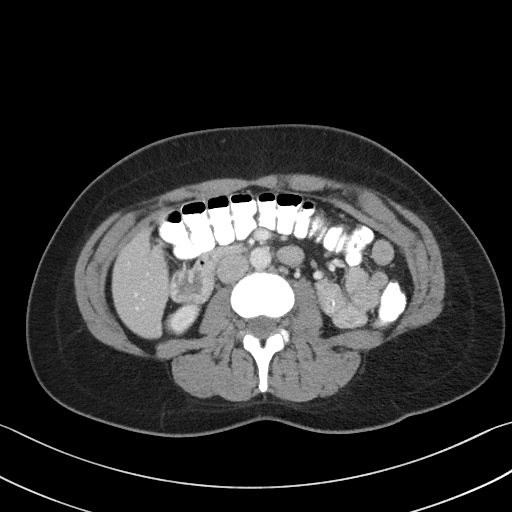
[im 54/87  bone]
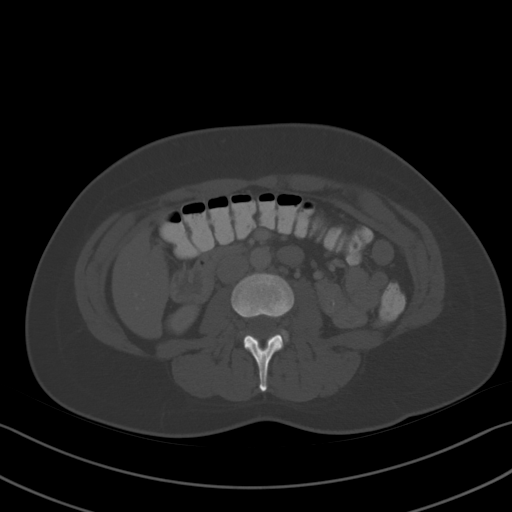
[im 60/87  soft-tissue]
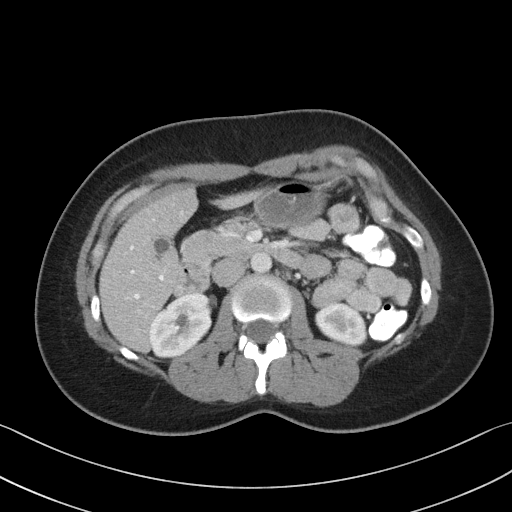
[im 70/87  soft-tissue]
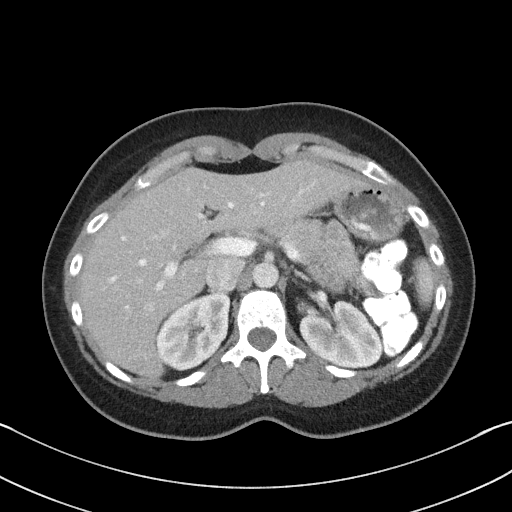
[im 76/87  soft-tissue]
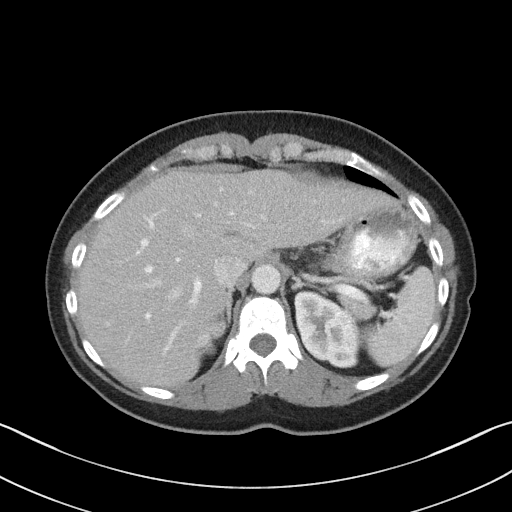
[im 81/87  soft-tissue]
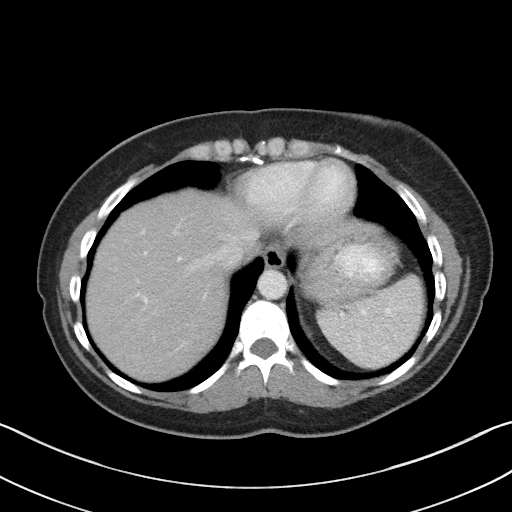

[Series 5: coronal st · coronal · 0.80mm/px · 3 of 72 slices shown]
[im 24/72  soft-tissue]
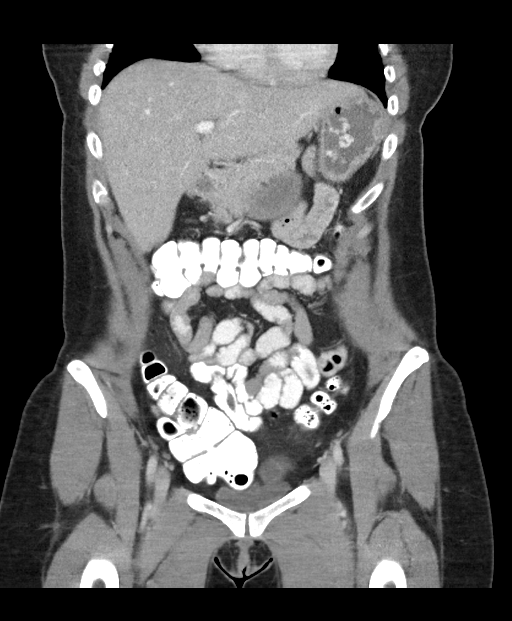
[im 32/72  soft-tissue]
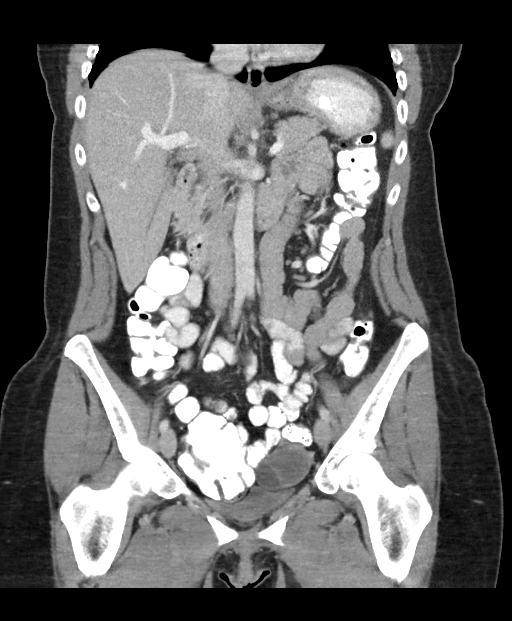
[im 40/72  soft-tissue]
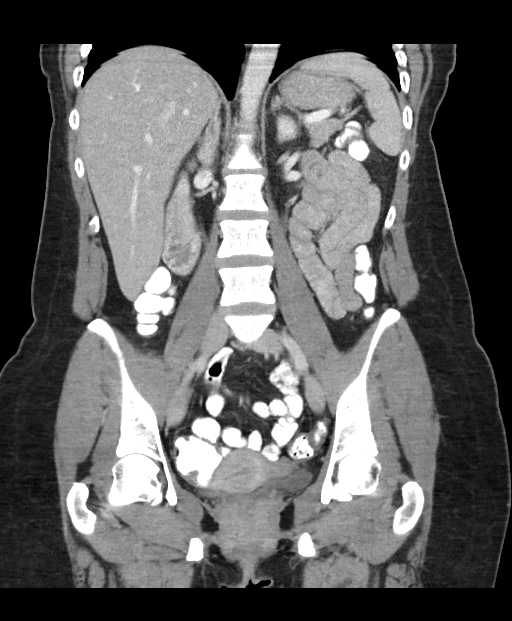

[16 of 46 positions shown; findings below may reference images not displayed]

FINDINGS: Lower Chest: No acute findings.

Hepatobiliary: No hepatic masses identified. Gallbladder is
unremarkable.

Pancreas:  No mass or inflammatory changes.

Spleen: Within normal limits in size and appearance.

Adrenals/Urinary Tract: No masses identified. No evidence of
hydronephrosis.

Stomach/Bowel: No evidence of obstruction, inflammatory process or
abnormal fluid collections.

Vascular/Lymphatic: No pathologically enlarged lymph nodes. No
abdominal aortic aneurysm.

Reproductive: Normal appearance of uterus. A complex cystic lesion
is seen in the left adnexa which measures 4.7 x 4.1 cm. This shows
at least 1 thin internal septation and mild mural nodularity
suspicious for cystic ovarian neoplasm. No evidence of peritoneal
thickening, nodularity, or ascites.

Other:  None.

Musculoskeletal:  No suspicious bone lesions identified.
IMPRESSION: 4.7 cm complex cystic lesion in the left adnexa, suspicious for
cystic ovarian neoplasm. Malignancy cannot be excluded.

No evidence of metastatic disease within the pelvis or abdomen.

## 2020-05-12 MED FILL — SUMAtriptan SUCCINATE 50 MG: 50 | 30 days supply | Qty: 9 | Fill #1

## 2020-06-28 ENCOUNTER — Encounter: Payer: Self-pay | Admitting: Family Medicine

## 2020-06-28 ENCOUNTER — Ambulatory Visit (INDEPENDENT_AMBULATORY_CARE_PROVIDER_SITE_OTHER): Payer: 59 | Admitting: Family Medicine

## 2020-06-28 ENCOUNTER — Other Ambulatory Visit: Payer: Self-pay

## 2020-06-28 VITALS — BP 138/86 | Ht 65.0 in | Wt 160.0 lb

## 2020-06-28 DIAGNOSIS — M7742 Metatarsalgia, left foot: Secondary | ICD-10-CM

## 2020-06-28 DIAGNOSIS — G5762 Lesion of plantar nerve, left lower limb: Secondary | ICD-10-CM | POA: Diagnosis not present

## 2020-06-28 NOTE — Patient Instructions (Signed)
You have metatarsalgia with a morton's neuroma between 3rd and 4th digits. Metatarsal pads are the most important part of your treatment - wear in your running shoes and other shoes you commonly wear that don't provide you support to your forefoot. If not improving can consider injection, larger pad. Message me if you're not improving and can't get back to running over the next 2-3 weeks.

## 2020-06-28 NOTE — Progress Notes (Signed)
PCP: Doristine Bosworth, MD  Subjective:   HPI: Patient is a 45 y.o. female here for left foot pain.  Patient reports a couple years ago she began having plantar left foot pain. Seemed to respond with a metatarsal pad somewhat though not completely - felt like she could never get the placement right of the pads. Pain worse with skiing. Better with danzgos, birkenstocks. Associated numbness into the toes. Feels like there's something between 3rd and 4th digits.  Past Medical History:  Diagnosis Date  . Anemia   . History of spontaneous abortion    (2) 7 week Miscarriages last 04-2018  . Migraines     Current Outpatient Medications on File Prior to Visit  Medication Sig Dispense Refill  . buPROPion (WELLBUTRIN XL) 300 MG 24 hr tablet Take 1 tablet (300 mg total) by mouth daily. 90 tablet 3  . EPINEPHrine (EPIPEN 2-PAK) 0.3 mg/0.3 mL IJ SOAJ injection Inject 0.3 mg into the muscle as needed for anaphylaxis.    . SUMAtriptan (IMITREX) 100 MG tablet Take 0.5 tablets (50 mg total) by mouth every 2 (two) hours as needed for migraine. May repeat in 2 hours if headache persists or recurs. 10 tablet 2  . SUMAtriptan (IMITREX) 50 MG tablet TAKE 1 TABLET BY MOUTH AS SOON AS POSSIBLE AFTER MIGRAINE ONSET THEN EVERY 2 HOURS AS NEEDED. 9 tablet 3   No current facility-administered medications on file prior to visit.    Past Surgical History:  Procedure Laterality Date  . ROBOTIC ASSISTED SALPINGO OOPHERECTOMY Left 10/08/2018   Procedure: XI ROBOTIC ASSISTED LEFT SALPINGO OOPHORECTOMY;  Surgeon: Adolphus Birchwood, MD;  Location: [facility];  Service: Gynecology;  Laterality: Left;    Allergies  Allergen Reactions  . Bee Venom Anaphylaxis  . Sulfa Antibiotics Itching    Social History   Socioeconomic History  . Marital status: Single    Spouse name: Not on file  . Number of children: Not on file  . Years of education: Not on file  . Highest education level: Not on file  Occupational History  .  Not on file  Tobacco Use  . Smoking status: Never Smoker  . Smokeless tobacco: Never Used  Vaping Use  . Vaping Use: Never used  Substance and Sexual Activity  . Alcohol use: Yes    Comment: occasional drink  . Drug use: No  . Sexual activity: Yes  Other Topics Concern  . Not on file  Social History Narrative  . Not on file   Social Determinants of Health   Financial Resource Strain: Not on file  Food Insecurity: Not on file  Transportation Needs: Not on file  Physical Activity: Not on file  Stress: Not on file  Social Connections: Not on file  Intimate Partner Violence: Not on file    Family History  Problem Relation Age of Onset  . Lymphoma Paternal Grandmother   . Breast cancer Paternal Aunt     BP 138/86   Ht 5\' 5"  (1.651 m)   Wt 160 lb (72.6 kg)   BMI 26.63 kg/m   [facility] Adult Exercise 06/28/2020  Frequency of aerobic exercise (# of days/week) 2  Average time in minutes 25  Frequency of strengthening activities (# of days/week) 0    No flowsheet data found.  Review of Systems: See HPI above.     Objective:  Physical Exam:  Gen: NAD, comfortable in exam room  Left foot/ankle: No gross deformity, swelling, ecchymoses FROM digits without pain.  Minimal TTP plantar 4th MT head, between 4th and 5th MT heads Negative syndesmotic compression. Thompsons test negative. Negative metatarsal squeeze. Click with mulder's. NV intact distally.   Limited MSK u/s:  Swelling of digital nerve between 3rd and 4th digits.  No cortical irregularity of 4th, 5th MTs.  Assessment & Plan:  1. Left morton's neuroma with metatarsalgia.  New metatarsal pad placed today - to wear with running shoes and in other shoes that are uncomfortable.  Advised we could place these in different inserts/shoes as well in the future.  Consider injection if pain worsens.

## 2020-08-25 ENCOUNTER — Ambulatory Visit (INDEPENDENT_AMBULATORY_CARE_PROVIDER_SITE_OTHER): Payer: 59 | Admitting: Family Medicine

## 2020-08-25 ENCOUNTER — Other Ambulatory Visit: Payer: Self-pay

## 2020-08-25 ENCOUNTER — Encounter: Payer: Self-pay | Admitting: Family Medicine

## 2020-08-25 VITALS — BP 110/80 | Ht 65.0 in

## 2020-08-25 DIAGNOSIS — M7742 Metatarsalgia, left foot: Secondary | ICD-10-CM

## 2020-08-25 DIAGNOSIS — G5762 Lesion of plantar nerve, left lower limb: Secondary | ICD-10-CM | POA: Diagnosis not present

## 2020-08-25 MED ORDER — METHYLPREDNISOLONE ACETATE 40 MG/ML IJ SUSP
20.0000 mg | Freq: Once | INTRAMUSCULAR | Status: AC
  Administered 2020-08-25: 20 mg via INTRA_ARTICULAR

## 2020-08-25 NOTE — Progress Notes (Addendum)
    SUBJECTIVE:   CHIEF COMPLAINT / HPI:   Left morton's neuroma with metatarsalgia Seen in clinic on 06/28/20 for left foot pain. Has been using metatarsal pads since the visit which she has placed in her running shoes which she is struggling with as they keep moving. Pt is only able to run 3 miles a week and she would like to run more. She is interested in steroid injection discussed the last visit to help with the pain. Pt denies pain this morning.   PERTINENT  PMH / PSH: mortons neuroma   OBJECTIVE:   BP 110/80   Ht 5\' 5"  (1.651 m)   BMI 26.63 kg/m    Foot: Inspection:  No obvious bony deformity.  No swelling, erythema, or bruising.  Normal long arch.  Mod transverse collapse. Palpation: No tenderness to palpation though localizes pain to between 3rd and 4th MT heads. ROM: Full ROM of the ankle. Normal midfoot flexibility Strength: 5/5 strength ankle in all planes Neurovascular: N/V intact distally in the lower extremity Special tests. Negative squeeze.  Normal gait.   ASSESSMENT/PLAN:   Metatarsalgia of left foot Metatarsalgia not responsive to metatarsal pads. Pt opted for steroid injection. Continue to wear metatarsal pads and adequate running shoes. Follow up as needed.    After informed written consent timeout was performed, patient was seated on exam table. Dorsal left foot between 3rd and 4th metatarsal heads was prepped with alcohol swab and then injected with 0.5:0.7mL lidocaine: depomedrol. Patient tolerated the procedure well without immediate complications.   4m, MD [facility]

## 2020-08-25 NOTE — Assessment & Plan Note (Addendum)
Metatarsalgia not responsive to metatarsal pads. Pt opted for steroid injections. Pt consented. Dr Pearletha Forge injected 20mg  solumedrol, 1cc lidocaine between 3rd and 4th metarsals. Tolerated procedure well. Continue to wear metatarsal pads and adequate running shoes. Follow up as needed.

## 2020-11-06 DIAGNOSIS — S92355A Nondisplaced fracture of fifth metatarsal bone, left foot, initial encounter for closed fracture: Secondary | ICD-10-CM | POA: Diagnosis not present

## 2020-11-12 DIAGNOSIS — M79672 Pain in left foot: Secondary | ICD-10-CM | POA: Diagnosis not present

## 2020-11-12 DIAGNOSIS — M79675 Pain in left toe(s): Secondary | ICD-10-CM | POA: Diagnosis not present

## 2021-06-24 DIAGNOSIS — N96 Recurrent pregnancy loss: Secondary | ICD-10-CM | POA: Diagnosis not present

## 2021-06-24 DIAGNOSIS — N809 Endometriosis, unspecified: Secondary | ICD-10-CM | POA: Diagnosis not present

## 2021-06-24 DIAGNOSIS — Z3169 Encounter for other general counseling and advice on procreation: Secondary | ICD-10-CM | POA: Diagnosis not present

## 2021-07-08 ENCOUNTER — Other Ambulatory Visit (HOSPITAL_COMMUNITY): Payer: Self-pay

## 2021-07-08 MED ORDER — "INSULIN SYRINGE-NEEDLE U-100 25G X 1"" 1 ML MISC"
3 refills | Status: DC
  Filled 2021-09-09: qty 10, 10d supply, fill #0

## 2021-07-08 MED ORDER — MENOPUR 75 UNITS ~~LOC~~ SOLR
SUBCUTANEOUS | 3 refills | Status: DC
  Filled 2021-07-22: qty 10, 30d supply, fill #0
  Filled 2021-09-09: qty 10, 10d supply, fill #0
  Filled 2021-09-22: qty 10, 10d supply, fill #1

## 2021-07-08 MED ORDER — "SYRINGE/NEEDLE (DISP) 18G X 1.5"" 10 ML MISC": 3 refills | Status: DC

## 2021-07-08 MED ORDER — "INSULIN SYRINGE-NEEDLE U-100 30G X 1/2"" 0.5 ML MISC"
3 refills | Status: DC
  Filled 2021-09-09: qty 30, 30d supply, fill #0
  Filled 2021-09-22: qty 30, 30d supply, fill #1
  Filled 2021-12-02: qty 30, 30d supply, fill #2

## 2021-07-08 MED ORDER — "SYRINGE/NEEDLE (DISP) 25G X 5/8"" 3 ML MISC"
3 refills | Status: DC
  Filled 2021-09-09: qty 30, 15d supply, fill #0
  Filled 2021-11-17 (×2): qty 30, 15d supply, fill #1

## 2021-07-08 MED ORDER — PREDNISONE 10 MG PO TABS
ORAL_TABLET | ORAL | 3 refills | Status: DC
  Filled 2021-09-15: qty 60, 30d supply, fill #0
  Filled 2021-11-17: qty 60, 30d supply, fill #1

## 2021-07-08 MED ORDER — DOXYCYCLINE HYCLATE 100 MG PO TABS
ORAL_TABLET | ORAL | 3 refills | Status: DC
  Filled 2021-11-17: qty 10, 5d supply, fill #0

## 2021-07-08 MED ORDER — GONAL-F 450 UNITS IJ SOLR
INTRAMUSCULAR | 3 refills | Status: DC
  Filled 2021-07-22 – 2021-09-09 (×2): qty 3, 3d supply, fill #0
  Filled 2021-09-20: qty 3, 9d supply, fill #1
  Filled 2021-09-29: qty 3, 9d supply, fill #2
  Filled 2021-11-04: qty 2, 4d supply, fill #3

## 2021-07-08 MED ORDER — "SYRINGE/NEEDLE (DISP) 18G X 1.5"" 10 ML MISC"
3 refills | Status: DC
  Filled 2021-09-09: qty 30, fill #0

## 2021-07-08 MED ORDER — OMNITROPE 5.8 MG ~~LOC~~ SOLR
SUBCUTANEOUS | 3 refills | Status: DC
  Filled 2021-07-22: qty 2, 30d supply, fill #0
  Filled 2021-08-05: qty 1, 22d supply, fill #0
  Filled 2021-08-20: qty 2, 30d supply, fill #1
  Filled 2021-10-10: qty 2, 30d supply, fill #2
  Filled 2021-11-04: qty 2, 30d supply, fill #3
  Filled 2022-01-11: qty 1, 15d supply, fill #4

## 2021-07-08 MED ORDER — ENOXAPARIN SODIUM 30 MG/0.3ML IJ SOSY: PREFILLED_SYRINGE | INTRAMUSCULAR | 3 refills | Status: DC

## 2021-07-08 MED ORDER — "SYRINGE/NEEDLE (DISP) 25G X 5/8"" 3 ML MISC": 3 refills | Status: DC

## 2021-07-08 MED ORDER — "SYRINGE/NEEDLE (DISP) 22G X 1-1/2"" 1.5 ML MISC": 3 refills | Status: DC

## 2021-07-08 MED ORDER — BD SHARPS CONTAINER HOME MISC: 3 refills | Status: DC

## 2021-07-08 MED ORDER — LEUPROLIDE ACETATE 1 MG/0.2ML IJ KIT
PACK | INTRAMUSCULAR | 3 refills | Status: DC
  Filled 2021-07-22: qty 1, 14d supply, fill #0
  Filled 2021-09-09: qty 1, 2d supply, fill #0

## 2021-07-08 MED ORDER — "INSULIN SYRINGE-NEEDLE U-100 30G X 1/2"" 0.5 ML MISC"
3 refills | Status: DC
  Filled 2021-09-09: qty 30, 30d supply, fill #0

## 2021-07-13 DIAGNOSIS — Z3141 Encounter for fertility testing: Secondary | ICD-10-CM | POA: Diagnosis not present

## 2021-07-19 DIAGNOSIS — H5213 Myopia, bilateral: Secondary | ICD-10-CM | POA: Diagnosis not present

## 2021-07-22 ENCOUNTER — Other Ambulatory Visit (HOSPITAL_COMMUNITY): Payer: Self-pay

## 2021-08-05 ENCOUNTER — Other Ambulatory Visit (HOSPITAL_COMMUNITY): Payer: Self-pay

## 2021-08-06 ENCOUNTER — Other Ambulatory Visit (HOSPITAL_COMMUNITY): Payer: Self-pay

## 2021-08-06 MED ORDER — HYDROXYCHLOROQUINE SULFATE 200 MG PO TABS
200.0000 mg | ORAL_TABLET | Freq: Every day | ORAL | 3 refills | Status: DC
  Filled 2021-08-06: qty 30, 30d supply, fill #0
  Filled 2021-09-09: qty 30, 30d supply, fill #1
  Filled 2021-10-10: qty 30, 30d supply, fill #2
  Filled 2021-11-04: qty 30, 30d supply, fill #3

## 2021-08-08 ENCOUNTER — Other Ambulatory Visit (HOSPITAL_COMMUNITY): Payer: Self-pay

## 2021-08-16 ENCOUNTER — Other Ambulatory Visit (HOSPITAL_COMMUNITY): Payer: Self-pay

## 2021-08-17 ENCOUNTER — Other Ambulatory Visit (HOSPITAL_COMMUNITY): Payer: Self-pay

## 2021-08-20 ENCOUNTER — Other Ambulatory Visit (HOSPITAL_COMMUNITY): Payer: Self-pay

## 2021-08-20 MED ORDER — NOVAREL 5000 UNITS IM SOLR
INTRAMUSCULAR | 3 refills | Status: DC
  Filled 2021-08-20: qty 2, 1d supply, fill #0
  Filled 2021-11-04: qty 2, 1d supply, fill #1

## 2021-08-23 ENCOUNTER — Other Ambulatory Visit (HOSPITAL_COMMUNITY): Payer: Self-pay

## 2021-08-24 ENCOUNTER — Other Ambulatory Visit (HOSPITAL_COMMUNITY): Payer: Self-pay

## 2021-08-25 ENCOUNTER — Other Ambulatory Visit (HOSPITAL_COMMUNITY): Payer: Self-pay

## 2021-09-09 ENCOUNTER — Other Ambulatory Visit (HOSPITAL_COMMUNITY): Payer: Self-pay

## 2021-09-10 ENCOUNTER — Other Ambulatory Visit (HOSPITAL_COMMUNITY): Payer: Self-pay

## 2021-09-12 ENCOUNTER — Other Ambulatory Visit (HOSPITAL_COMMUNITY): Payer: Self-pay

## 2021-09-13 ENCOUNTER — Other Ambulatory Visit (HOSPITAL_COMMUNITY): Payer: Self-pay

## 2021-09-14 ENCOUNTER — Other Ambulatory Visit (HOSPITAL_COMMUNITY): Payer: Self-pay

## 2021-09-15 ENCOUNTER — Other Ambulatory Visit (HOSPITAL_COMMUNITY): Payer: Self-pay

## 2021-09-15 DIAGNOSIS — Z3183 Encounter for assisted reproductive fertility procedure cycle: Secondary | ICD-10-CM | POA: Diagnosis not present

## 2021-09-19 ENCOUNTER — Other Ambulatory Visit (HOSPITAL_COMMUNITY): Payer: Self-pay

## 2021-09-19 MED ORDER — CETROTIDE 0.25 MG ~~LOC~~ KIT
PACK | SUBCUTANEOUS | 3 refills | Status: DC
  Filled 2021-09-19: qty 6, 12d supply, fill #0
  Filled 2021-09-29: qty 6, 12d supply, fill #1
  Filled 2021-11-04: qty 6, 12d supply, fill #2

## 2021-09-20 ENCOUNTER — Other Ambulatory Visit (HOSPITAL_COMMUNITY): Payer: Self-pay

## 2021-09-20 DIAGNOSIS — E289 Ovarian dysfunction, unspecified: Secondary | ICD-10-CM | POA: Diagnosis not present

## 2021-09-20 DIAGNOSIS — Z3183 Encounter for assisted reproductive fertility procedure cycle: Secondary | ICD-10-CM | POA: Diagnosis not present

## 2021-09-21 ENCOUNTER — Other Ambulatory Visit (HOSPITAL_COMMUNITY): Payer: Self-pay

## 2021-09-22 ENCOUNTER — Other Ambulatory Visit (HOSPITAL_COMMUNITY): Payer: Self-pay

## 2021-09-23 ENCOUNTER — Other Ambulatory Visit (HOSPITAL_COMMUNITY): Payer: Self-pay

## 2021-09-23 DIAGNOSIS — Z3183 Encounter for assisted reproductive fertility procedure cycle: Secondary | ICD-10-CM | POA: Diagnosis not present

## 2021-09-26 ENCOUNTER — Other Ambulatory Visit (HOSPITAL_COMMUNITY): Payer: Self-pay

## 2021-09-26 DIAGNOSIS — Z3183 Encounter for assisted reproductive fertility procedure cycle: Secondary | ICD-10-CM | POA: Diagnosis not present

## 2021-09-26 MED ORDER — NIVESTYM 300 MCG/ML IJ SOLN
INTRAMUSCULAR | 3 refills | Status: DC
  Filled 2021-09-26: qty 2, 10d supply, fill #0

## 2021-09-28 ENCOUNTER — Encounter (HOSPITAL_COMMUNITY): Payer: Self-pay | Admitting: Pharmacist

## 2021-09-28 ENCOUNTER — Other Ambulatory Visit (HOSPITAL_COMMUNITY): Payer: Self-pay

## 2021-09-28 DIAGNOSIS — Z3183 Encounter for assisted reproductive fertility procedure cycle: Secondary | ICD-10-CM | POA: Diagnosis not present

## 2021-09-29 ENCOUNTER — Other Ambulatory Visit (HOSPITAL_COMMUNITY): Payer: Self-pay

## 2021-09-29 DIAGNOSIS — Z3183 Encounter for assisted reproductive fertility procedure cycle: Secondary | ICD-10-CM | POA: Diagnosis not present

## 2021-09-29 MED ORDER — "INSULIN SYRINGE-NEEDLE U-100 31G X 5/16"" 0.5 ML MISC"
3 refills | Status: DC
  Filled 2021-11-17 (×2): qty 20, 20d supply, fill #0
  Filled 2022-01-02: qty 20, 20d supply, fill #1

## 2021-09-30 ENCOUNTER — Other Ambulatory Visit (HOSPITAL_COMMUNITY): Payer: Self-pay

## 2021-10-03 DIAGNOSIS — Z3141 Encounter for fertility testing: Secondary | ICD-10-CM | POA: Diagnosis not present

## 2021-10-03 DIAGNOSIS — Z1341 Encounter for autism screening: Secondary | ICD-10-CM | POA: Diagnosis not present

## 2021-10-03 DIAGNOSIS — N840 Polyp of corpus uteri: Secondary | ICD-10-CM | POA: Diagnosis not present

## 2021-10-03 DIAGNOSIS — Z3183 Encounter for assisted reproductive fertility procedure cycle: Secondary | ICD-10-CM | POA: Diagnosis not present

## 2021-10-03 DIAGNOSIS — Z3169 Encounter for other general counseling and advice on procreation: Secondary | ICD-10-CM | POA: Diagnosis not present

## 2021-10-10 ENCOUNTER — Other Ambulatory Visit (HOSPITAL_COMMUNITY): Payer: Self-pay

## 2021-10-11 ENCOUNTER — Other Ambulatory Visit (HOSPITAL_COMMUNITY): Payer: Self-pay

## 2021-10-31 DIAGNOSIS — N979 Female infertility, unspecified: Secondary | ICD-10-CM | POA: Diagnosis not present

## 2021-10-31 DIAGNOSIS — Z3169 Encounter for other general counseling and advice on procreation: Secondary | ICD-10-CM | POA: Diagnosis not present

## 2021-11-04 ENCOUNTER — Other Ambulatory Visit (HOSPITAL_COMMUNITY): Payer: Self-pay

## 2021-11-04 MED ORDER — GONAL-F 450 UNITS IJ SOLR
INTRAMUSCULAR | 3 refills | Status: DC
  Filled 2021-11-04: qty 6, 12d supply, fill #0

## 2021-11-04 MED ORDER — MENOPUR 75 UNITS ~~LOC~~ SOLR
SUBCUTANEOUS | 3 refills | Status: DC
  Filled 2021-11-04: qty 25, 25d supply, fill #0

## 2021-11-07 ENCOUNTER — Other Ambulatory Visit (HOSPITAL_COMMUNITY): Payer: Self-pay

## 2021-11-08 ENCOUNTER — Other Ambulatory Visit (HOSPITAL_COMMUNITY): Payer: Self-pay

## 2021-11-09 ENCOUNTER — Other Ambulatory Visit (HOSPITAL_COMMUNITY): Payer: Self-pay

## 2021-11-17 ENCOUNTER — Other Ambulatory Visit (HOSPITAL_COMMUNITY): Payer: Self-pay

## 2021-11-18 ENCOUNTER — Other Ambulatory Visit (HOSPITAL_COMMUNITY): Payer: Self-pay

## 2021-11-18 MED ORDER — DESOGESTREL-ETHINYL ESTRADIOL 0.15-30 MG-MCG PO TABS
ORAL_TABLET | ORAL | 3 refills | Status: DC
  Filled 2021-11-18: qty 28, 28d supply, fill #0
  Filled 2021-12-07 – 2022-01-02 (×2): qty 28, 28d supply, fill #1
  Filled 2022-01-31: qty 28, 28d supply, fill #2
  Filled 2022-03-15: qty 28, 28d supply, fill #3

## 2021-11-21 ENCOUNTER — Other Ambulatory Visit (HOSPITAL_COMMUNITY): Payer: Self-pay

## 2021-11-22 ENCOUNTER — Other Ambulatory Visit (HOSPITAL_COMMUNITY): Payer: Self-pay

## 2021-11-22 DIAGNOSIS — Z1339 Encounter for screening examination for other mental health and behavioral disorders: Secondary | ICD-10-CM | POA: Diagnosis not present

## 2021-11-22 DIAGNOSIS — Z6829 Body mass index (BMI) 29.0-29.9, adult: Secondary | ICD-10-CM | POA: Diagnosis not present

## 2021-11-22 DIAGNOSIS — Z0000 Encounter for general adult medical examination without abnormal findings: Secondary | ICD-10-CM | POA: Diagnosis not present

## 2021-11-22 DIAGNOSIS — G43909 Migraine, unspecified, not intractable, without status migrainosus: Secondary | ICD-10-CM | POA: Diagnosis not present

## 2021-11-22 DIAGNOSIS — Z1331 Encounter for screening for depression: Secondary | ICD-10-CM | POA: Diagnosis not present

## 2021-11-22 MED ORDER — SUMATRIPTAN SUCCINATE 50 MG PO TABS
ORAL_TABLET | ORAL | 2 refills | Status: DC
  Filled 2021-11-22: qty 10, 30d supply, fill #0
  Filled 2022-08-03: qty 10, 30d supply, fill #1

## 2021-11-24 DIAGNOSIS — O0900 Supervision of pregnancy with history of infertility, unspecified trimester: Secondary | ICD-10-CM | POA: Diagnosis not present

## 2021-12-06 ENCOUNTER — Other Ambulatory Visit (HOSPITAL_COMMUNITY): Payer: Self-pay

## 2021-12-06 MED ORDER — "INSULIN SYRINGE-NEEDLE U-100 30G X 1/2"" 1 ML MISC"
3 refills | Status: DC
  Filled 2021-12-07: qty 100, 90d supply, fill #0

## 2021-12-06 MED ORDER — OMNITROPE 5.8 MG ~~LOC~~ SOLR
SUBCUTANEOUS | 3 refills | Status: DC
  Filled 2021-12-07: qty 1, 30d supply, fill #0

## 2021-12-07 ENCOUNTER — Other Ambulatory Visit (HOSPITAL_COMMUNITY): Payer: Self-pay

## 2021-12-07 DIAGNOSIS — O0900 Supervision of pregnancy with history of infertility, unspecified trimester: Secondary | ICD-10-CM | POA: Diagnosis not present

## 2021-12-08 ENCOUNTER — Other Ambulatory Visit (HOSPITAL_COMMUNITY): Payer: Self-pay

## 2021-12-09 ENCOUNTER — Other Ambulatory Visit (HOSPITAL_COMMUNITY): Payer: Self-pay

## 2021-12-09 MED ORDER — HYDROXYCHLOROQUINE SULFATE 200 MG PO TABS
200.0000 mg | ORAL_TABLET | Freq: Every day | ORAL | 3 refills | Status: DC
  Filled 2021-12-09: qty 30, 30d supply, fill #0
  Filled 2022-01-02: qty 30, 30d supply, fill #1
  Filled 2022-01-31: qty 30, 30d supply, fill #2
  Filled 2022-03-15: qty 30, 30d supply, fill #3

## 2021-12-10 ENCOUNTER — Other Ambulatory Visit (HOSPITAL_COMMUNITY): Payer: Self-pay

## 2022-01-05 ENCOUNTER — Other Ambulatory Visit (HOSPITAL_COMMUNITY): Payer: Self-pay

## 2022-01-06 ENCOUNTER — Other Ambulatory Visit (HOSPITAL_COMMUNITY): Payer: Self-pay

## 2022-01-06 ENCOUNTER — Other Ambulatory Visit: Payer: Self-pay

## 2022-01-06 MED ORDER — FLUARIX QUADRIVALENT 0.5 ML IM SUSY
0.5000 mL | PREFILLED_SYRINGE | Freq: Once | INTRAMUSCULAR | 0 refills | Status: AC
  Filled 2022-01-06: qty 0.5, 1d supply, fill #0

## 2022-01-09 ENCOUNTER — Other Ambulatory Visit (HOSPITAL_COMMUNITY): Payer: Self-pay

## 2022-01-10 ENCOUNTER — Other Ambulatory Visit (HOSPITAL_COMMUNITY): Payer: Self-pay

## 2022-01-11 ENCOUNTER — Other Ambulatory Visit (HOSPITAL_COMMUNITY): Payer: Self-pay

## 2022-01-12 ENCOUNTER — Other Ambulatory Visit (HOSPITAL_COMMUNITY): Payer: Self-pay

## 2022-01-13 ENCOUNTER — Other Ambulatory Visit (HOSPITAL_COMMUNITY): Payer: Self-pay

## 2022-01-13 MED ORDER — OMNITROPE 5.8 MG ~~LOC~~ SOLR
Freq: Every day | SUBCUTANEOUS | 3 refills | Status: DC
  Filled 2022-01-13: qty 1, 0d supply, fill #0
  Filled 2022-02-11: qty 1, 10d supply, fill #0

## 2022-01-13 MED ORDER — "SYRINGE/NEEDLE (DISP) 25G X 5/8"" 3 ML MISC": 3 refills | Status: DC

## 2022-01-13 MED ORDER — "INSULIN SYRINGE-NEEDLE U-100 31G X 5/16"" 0.5 ML MISC"
3 refills | Status: DC
  Filled 2022-01-31: qty 20, 20d supply, fill #0

## 2022-01-31 ENCOUNTER — Other Ambulatory Visit (HOSPITAL_COMMUNITY): Payer: Self-pay

## 2022-02-13 ENCOUNTER — Other Ambulatory Visit (HOSPITAL_COMMUNITY): Payer: Self-pay

## 2022-02-14 ENCOUNTER — Other Ambulatory Visit (HOSPITAL_COMMUNITY): Payer: Self-pay

## 2022-04-10 ENCOUNTER — Other Ambulatory Visit (HOSPITAL_COMMUNITY): Payer: Self-pay

## 2022-04-10 MED ORDER — DESOGESTREL-ETHINYL ESTRADIOL 0.15-30 MG-MCG PO TABS
ORAL_TABLET | ORAL | 12 refills | Status: DC
  Filled 2022-04-10: qty 112, 84d supply, fill #0
  Filled 2022-07-06: qty 56, 48d supply, fill #1
  Filled 2022-07-07: qty 56, 36d supply, fill #1
  Filled 2022-09-11 – 2022-09-12 (×3): qty 112, 84d supply, fill #2

## 2022-04-14 ENCOUNTER — Other Ambulatory Visit (HOSPITAL_COMMUNITY): Payer: Self-pay

## 2022-07-07 ENCOUNTER — Other Ambulatory Visit: Payer: Self-pay

## 2022-07-07 ENCOUNTER — Other Ambulatory Visit (HOSPITAL_COMMUNITY): Payer: Self-pay

## 2022-08-03 ENCOUNTER — Other Ambulatory Visit (HOSPITAL_COMMUNITY): Payer: Self-pay

## 2022-08-03 ENCOUNTER — Other Ambulatory Visit: Payer: Self-pay

## 2022-08-12 ENCOUNTER — Other Ambulatory Visit (HOSPITAL_COMMUNITY): Payer: Self-pay

## 2022-09-11 ENCOUNTER — Other Ambulatory Visit (HOSPITAL_COMMUNITY): Payer: Self-pay

## 2022-10-05 DIAGNOSIS — H5213 Myopia, bilateral: Secondary | ICD-10-CM | POA: Diagnosis not present

## 2022-11-06 DIAGNOSIS — Z0000 Encounter for general adult medical examination without abnormal findings: Secondary | ICD-10-CM | POA: Diagnosis not present

## 2022-11-06 DIAGNOSIS — E559 Vitamin D deficiency, unspecified: Secondary | ICD-10-CM | POA: Diagnosis not present

## 2022-11-06 DIAGNOSIS — Z1159 Encounter for screening for other viral diseases: Secondary | ICD-10-CM | POA: Diagnosis not present

## 2022-11-06 DIAGNOSIS — Z8639 Personal history of other endocrine, nutritional and metabolic disease: Secondary | ICD-10-CM | POA: Diagnosis not present

## 2022-11-06 DIAGNOSIS — Z9889 Other specified postprocedural states: Secondary | ICD-10-CM | POA: Diagnosis not present

## 2022-11-06 DIAGNOSIS — Z862 Personal history of diseases of the blood and blood-forming organs and certain disorders involving the immune mechanism: Secondary | ICD-10-CM | POA: Diagnosis not present

## 2022-11-29 ENCOUNTER — Other Ambulatory Visit (HOSPITAL_COMMUNITY): Payer: Self-pay

## 2022-11-29 DIAGNOSIS — N912 Amenorrhea, unspecified: Secondary | ICD-10-CM | POA: Diagnosis not present

## 2022-11-29 DIAGNOSIS — Z3481 Encounter for supervision of other normal pregnancy, first trimester: Secondary | ICD-10-CM | POA: Diagnosis not present

## 2022-11-29 DIAGNOSIS — O09511 Supervision of elderly primigravida, first trimester: Secondary | ICD-10-CM | POA: Diagnosis not present

## 2022-11-29 DIAGNOSIS — Z369 Encounter for antenatal screening, unspecified: Secondary | ICD-10-CM | POA: Diagnosis not present

## 2022-11-29 DIAGNOSIS — Z3183 Encounter for assisted reproductive fertility procedure cycle: Secondary | ICD-10-CM | POA: Diagnosis not present

## 2022-11-30 ENCOUNTER — Other Ambulatory Visit (HOSPITAL_COMMUNITY): Payer: Self-pay

## 2022-11-30 MED ORDER — ESTRADIOL 2 MG PO TABS
8.0000 mg | ORAL_TABLET | Freq: Every day | ORAL | 0 refills | Status: DC
  Filled 2022-11-30: qty 120, 30d supply, fill #0

## 2022-11-30 MED ORDER — "SYRINGE/NEEDLE (DISP) 22G X 1"" 3 ML MISC"
1 refills | Status: DC
  Filled 2022-11-30: qty 30, 30d supply, fill #0
  Filled 2022-11-30 – 2022-12-01 (×2): qty 26, 26d supply, fill #0

## 2022-11-30 MED ORDER — HEPARIN SODIUM (PORCINE) 5000 UNIT/ML IJ SOLN
5000.0000 [IU] | Freq: Every day | INTRAMUSCULAR | 0 refills | Status: DC
  Filled 2022-11-30: qty 30, 30d supply, fill #0

## 2022-11-30 MED ORDER — PROGESTERONE 50 MG/ML IM OIL
25.0000 mg | TOPICAL_OIL | Freq: Every day | INTRAMUSCULAR | 1 refills | Status: DC
  Filled 2022-11-30: qty 10, 20d supply, fill #0
  Filled 2022-12-16: qty 10, 20d supply, fill #1

## 2022-11-30 MED ORDER — PROGESTERONE MICRONIZED 100 MG PO CAPS
300.0000 mg | ORAL_CAPSULE | Freq: Three times a day (TID) | ORAL | 2 refills | Status: DC
  Filled 2022-11-30: qty 270, 30d supply, fill #0

## 2022-12-01 ENCOUNTER — Other Ambulatory Visit (HOSPITAL_COMMUNITY): Payer: Self-pay

## 2022-12-03 ENCOUNTER — Other Ambulatory Visit (HOSPITAL_COMMUNITY): Payer: Self-pay

## 2022-12-03 MED ORDER — CEPHALEXIN 500 MG PO CAPS
500.0000 mg | ORAL_CAPSULE | Freq: Two times a day (BID) | ORAL | 0 refills | Status: DC
  Filled 2022-12-03: qty 14, 7d supply, fill #0

## 2022-12-04 ENCOUNTER — Other Ambulatory Visit (HOSPITAL_COMMUNITY): Payer: Self-pay

## 2022-12-11 DIAGNOSIS — Z331 Pregnant state, incidental: Secondary | ICD-10-CM | POA: Diagnosis not present

## 2022-12-11 DIAGNOSIS — O09511 Supervision of elderly primigravida, first trimester: Secondary | ICD-10-CM | POA: Diagnosis not present

## 2022-12-11 DIAGNOSIS — O2341 Unspecified infection of urinary tract in pregnancy, first trimester: Secondary | ICD-10-CM | POA: Diagnosis not present

## 2022-12-11 DIAGNOSIS — O3680X9 Pregnancy with inconclusive fetal viability, other fetus: Secondary | ICD-10-CM | POA: Diagnosis not present

## 2022-12-11 DIAGNOSIS — Z3A09 9 weeks gestation of pregnancy: Secondary | ICD-10-CM | POA: Diagnosis not present

## 2022-12-11 DIAGNOSIS — O09519 Supervision of elderly primigravida, unspecified trimester: Secondary | ICD-10-CM | POA: Diagnosis not present

## 2022-12-14 ENCOUNTER — Other Ambulatory Visit (HOSPITAL_COMMUNITY): Payer: Self-pay

## 2022-12-19 ENCOUNTER — Other Ambulatory Visit: Payer: Self-pay | Admitting: Obstetrics & Gynecology

## 2022-12-19 DIAGNOSIS — O039 Complete or unspecified spontaneous abortion without complication: Secondary | ICD-10-CM | POA: Diagnosis not present

## 2022-12-19 DIAGNOSIS — O3680X9 Pregnancy with inconclusive fetal viability, other fetus: Secondary | ICD-10-CM | POA: Diagnosis not present

## 2022-12-20 ENCOUNTER — Other Ambulatory Visit (HOSPITAL_COMMUNITY): Payer: Self-pay

## 2022-12-20 ENCOUNTER — Other Ambulatory Visit (HOSPITAL_COMMUNITY): Payer: Self-pay | Admitting: Obstetrics & Gynecology

## 2022-12-20 DIAGNOSIS — O021 Missed abortion: Secondary | ICD-10-CM

## 2022-12-21 ENCOUNTER — Other Ambulatory Visit: Payer: Self-pay

## 2022-12-21 ENCOUNTER — Encounter (HOSPITAL_COMMUNITY): Payer: Self-pay | Admitting: Obstetrics & Gynecology

## 2022-12-21 NOTE — Progress Notes (Signed)
PCP - none Cardiologist - none  Chest x-ray - n/a EKG - n/a Stress Test - n/a ECHO - n/a Cardiac Cath - n/a  ICD Pacemaker/Loop - n/a  Sleep Study -  n/a CPAP - none  Diabetes - n/a  ERAS: Clear liquids til 7:30 AM DOS.  STOP now taking any Aspirin (unless otherwise instructed by your surgeon), Aleve, Naproxen, Ibuprofen, Motrin, Advil, Goody's, BC's, all herbal medications, fish oil, and all vitamins.   Patient stated that she was not currently taking any medications.  Coronavirus Screening Do you have any of the following symptoms:  Cough yes/no: No Fever (>100.41F)  yes/no: No Runny nose yes/no: No Sore throat yes/no: No Difficulty breathing/shortness of breath  yes/no: No  Have you traveled in the last 14 days and where? yes/no: No  Patient verbalized understanding of instructions that were given via phone.

## 2022-12-23 DIAGNOSIS — N96 Recurrent pregnancy loss: Secondary | ICD-10-CM | POA: Diagnosis present

## 2022-12-23 DIAGNOSIS — O039 Complete or unspecified spontaneous abortion without complication: Secondary | ICD-10-CM | POA: Diagnosis present

## 2022-12-23 DIAGNOSIS — O09529 Supervision of elderly multigravida, unspecified trimester: Secondary | ICD-10-CM

## 2022-12-23 NOTE — H&P (Signed)
Heather Cole is an 47 y.o. female, presenting with for scheduled D&E due to missed AB.  Known A+ blood type.  Recent HGB 9/4 11.4.  Seen in office for initial NOB visit on 9/4, s/p IVF/embryo transfer in [place], with donor egg (age 41).  [place] that day showed viable pregnancy at 7 weeks. Patient reported hx of "multiple chemical pregnancies" in past, with with 2 definitive early pregnancy losses (unsure of years).    At NOB, ALT slightly elevated at 40, PCR elevated at 0.39, with positive urine culture for E coli, rx'd with cephalexin.  Planned to recheck urine culture and PCR/CMP after completion of ATB course.  Seen 9/16 for f/u viability scan--Fetal pole noted, without cardiac activity.  Requested another [place] for confirmation, options for management of the loss reviewed.  QHCG [phone] that day.  Repeat US 9/24--IUFD confirmed, 8 1/7 week size, 10 2/7 weeks by dates.  Patient elected to proceed with D&E, undecided at that time regarding Anora genetic testing.    Patient is a Designer, multimedia pediatrician in active practice.  Patient Active Problem List   Diagnosis Date Noted   AMA (advanced maternal age) multigravida 35+ 12/23/2022   History of recurrent miscarriages 12/23/2022   Miscarriage 12/11/22 12/23/2022   Left ovarian cyst 10/08/2018   Elevated CA-125 10/08/2018   History of systemic reaction to bee sting 04/09/2016   Migraine without aura and without status migrainosus, not intractable 04/09/2016   Iron deficiency anemia due to chronic blood loss 04/09/2016   Menorrhagia with regular cycle 04/09/2016    Pertinent Gynecological History: exually transmitted diseases: no past history Previous GYN Procedures:  Hx prophylactic oophrectomy, ? Side, due to ovarian cyst with elevated CA 125 2020   Last mammogram: None reported Last pap: normal Date: 2022 OB History: G3, P0030, with hx additional "multiple chemical pregnancies".   MEDICAL/FAMILY/SOCIAL HX: No LMP recorded. Patient is  pregnant. LMP 10/27/22 Hx chronic anemia due to menorrhagia, followed at hematolog. Migraines Hx ovarian cyst, with prophylactic oophrectomy 2020.   Past Medical History:  Diagnosis Date   History of spontaneous abortion    (2) 7 week Miscarriages last 04-2018   IDA (iron deficiency anemia)    Migraines    Ovarian cyst    hx - bilateral    Past Surgical History:  Procedure Laterality Date   ROBOTIC ASSISTED SALPINGO OOPHERECTOMY Left 10/08/2018   Procedure: XI ROBOTIC ASSISTED LEFT SALPINGO OOPHORECTOMY;  Surgeon: Adolphus Birchwood, MD;  Location: [facility];  Service: Gynecology;  Laterality: Left;    Family History  Problem Relation Age of Onset   Lymphoma Paternal Grandmother    Breast cancer Paternal Aunt     Social History:  reports that she has never smoked. She has never used smokeless tobacco. She reports current alcohol use. She reports that she does not use drugs.  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  Partner is involved and supportive.  ALLERGIES/MEDS:  Allergies:  Allergies  Allergen Reactions   Bee Venom Anaphylaxis   Sulfa Antibiotics Itching    No medications prior to admission.     ROS  Height 5\' 5"  (1.651 m), weight 79.4 kg. Physical Exam HENT:     Head: Normocephalic.  Cardiovascular:     Pulses: Normal pulses.     Heart sounds: Normal heart sounds.  Genitourinary:    Comments: Uterus small, NT Musculoskeletal:        General: Normal range of motion.  Skin:    General: Skin  is warm and dry.  Neurological:     Mental Status: She is alert.  Psychiatric:        Mood and Affect: Mood normal.        Behavior: Behavior normal.      ASSESSMENT: Missed AB at 8 1/7 weeks by size, 10 weeks by dates Hx IVF pregnancy Hx recurrent SAB AMA A+ blood type Recent positive urine culture E coli--treated 12/03/22 Recent elevated ALT (40) 11/29/22 Recent elevated PCR (0.39) 11/29/22 Hx chronic  anemia  PLAN: [facility] 9[facility]30[facility]4 per Dr. Sallye Ober. Routine pre-op care Confirm at time of admission if patient wants Anora testing on POC. Support to patient for loss.   Nigel Bridgeman CNM 12/23/2022, 10:16 AM

## 2022-12-25 ENCOUNTER — Encounter (HOSPITAL_COMMUNITY)
Admission: RE | Disposition: A | Discharge status: Home / Self Care | Payer: Self-pay | Source: Home / Self Care | Attending: Obstetrics & Gynecology

## 2022-12-25 ENCOUNTER — Other Ambulatory Visit: Payer: Self-pay

## 2022-12-25 ENCOUNTER — Ambulatory Visit (HOSPITAL_COMMUNITY)
Admission: RE | Admit: 2022-12-25 | Discharge: 2022-12-25 | Disposition: A | Discharge status: Home / Self Care | Payer: 59 | Source: Ambulatory Visit | Attending: Obstetrics & Gynecology | Admitting: Obstetrics & Gynecology

## 2022-12-25 ENCOUNTER — Ambulatory Visit (HOSPITAL_BASED_OUTPATIENT_CLINIC_OR_DEPARTMENT_OTHER): Payer: 59 | Admitting: Anesthesiology

## 2022-12-25 ENCOUNTER — Ambulatory Visit (HOSPITAL_COMMUNITY)
Admission: RE | Admit: 2022-12-25 | Discharge: 2022-12-25 | Disposition: A | Discharge status: Home / Self Care | Payer: 59 | Attending: Obstetrics & Gynecology | Admitting: Obstetrics & Gynecology

## 2022-12-25 ENCOUNTER — Other Ambulatory Visit (HOSPITAL_COMMUNITY): Payer: Self-pay

## 2022-12-25 ENCOUNTER — Ambulatory Visit (HOSPITAL_COMMUNITY): Payer: 59 | Admitting: Anesthesiology

## 2022-12-25 ENCOUNTER — Encounter (HOSPITAL_COMMUNITY): Payer: Self-pay | Admitting: Obstetrics & Gynecology

## 2022-12-25 DIAGNOSIS — O021 Missed abortion: Secondary | ICD-10-CM | POA: Diagnosis not present

## 2022-12-25 DIAGNOSIS — Z3A09 9 weeks gestation of pregnancy: Secondary | ICD-10-CM | POA: Diagnosis not present

## 2022-12-25 DIAGNOSIS — O09529 Supervision of elderly multigravida, unspecified trimester: Secondary | ICD-10-CM

## 2022-12-25 DIAGNOSIS — Z3A08 8 weeks gestation of pregnancy: Secondary | ICD-10-CM | POA: Insufficient documentation

## 2022-12-25 DIAGNOSIS — N96 Recurrent pregnancy loss: Secondary | ICD-10-CM | POA: Diagnosis present

## 2022-12-25 DIAGNOSIS — O039 Complete or unspecified spontaneous abortion without complication: Secondary | ICD-10-CM | POA: Diagnosis present

## 2022-12-25 HISTORY — DX: Iron deficiency anemia, unspecified: D50.9

## 2022-12-25 HISTORY — DX: Unspecified ovarian cyst, unspecified side: N83.209

## 2022-12-25 HISTORY — PX: OPERATIVE ULTRASOUND: SHX5996

## 2022-12-25 HISTORY — PX: DILATION AND EVACUATION: SHX1459

## 2022-12-25 LAB — APTT: aPTT: 29 s (ref 24–36)

## 2022-12-25 LAB — COMPREHENSIVE METABOLIC PANEL
ALT: 26 U/L (ref 0–44)
AST: 23 U/L (ref 15–41)
Albumin: 3.5 g/dL (ref 3.5–5.0)
Alkaline Phosphatase: 53 U/L (ref 38–126)
Anion gap: 12 (ref 5–15)
BUN: 11 mg/dL (ref 6–20)
CO2: 23 mmol/L (ref 22–32)
Calcium: 9 mg/dL (ref 8.9–10.3)
Chloride: 102 mmol/L (ref 98–111)
Creatinine, Ser: 0.78 mg/dL (ref 0.44–1.00)
GFR, Estimated: >60 mL/min (ref >60)
Glucose, Bld: 94 mg/dL (ref 70–99)
Potassium: 3.9 mmol/L (ref 3.5–5.1)
Sodium: 137 mmol/L (ref 135–145)
Total Bilirubin: 0.7 mg/dL (ref 0.3–1.2)
Total Protein: 7 g/dL (ref 6.5–8.1)

## 2022-12-25 LAB — CBC
HCT: 36.4 % (ref 36.0–46.0)
Hemoglobin: 12 g/dL (ref 12.0–15.0)
MCH: 32.6 pg (ref 26.0–34.0)
MCHC: 33 g/dL (ref 30.0–36.0)
MCV: 98.9 fL (ref 80.0–100.0)
Platelets: 307 10*3/uL (ref 150–400)
RBC: 3.68 MIL/uL — ABNORMAL LOW (ref 3.87–5.11)
RDW: 13 % (ref 11.5–15.5)
WBC: 7 10*3/uL (ref 4.0–10.5)
nRBC: 0 % (ref 0.0–0.2)

## 2022-12-25 LAB — PROTIME-INR
INR: 0.9 (ref 0.8–1.2)
Prothrombin Time: 12.8 s (ref 11.4–15.2)

## 2022-12-25 LAB — TYPE AND SCREEN
ABO/RH(D): A POS
Antibody Screen: NEGATIVE

## 2022-12-25 SURGERY — DILATION AND EVACUATION, UTERUS
Anesthesia: General | Site: Vagina

## 2022-12-25 MED ORDER — OXYCODONE HCL 5 MG/5ML PO SOLN: 5.0000 mg | Freq: Once | ORAL | Status: DC | PRN

## 2022-12-25 MED ORDER — OXYCODONE HCL 5 MG PO TABS: 5.0000 mg | ORAL_TABLET | Freq: Once | ORAL | Status: DC | PRN

## 2022-12-25 MED ORDER — PROPOFOL 10 MG/ML IV BOLUS
INTRAVENOUS | Status: AC
  Filled 2022-12-25: qty 20

## 2022-12-25 MED ORDER — IBUPROFEN 600 MG PO TABS
600.0000 mg | ORAL_TABLET | Freq: Four times a day (QID) | ORAL | 0 refills | Status: AC | PRN
  Filled 2022-12-25: qty 30, 8d supply, fill #0

## 2022-12-25 MED ORDER — BUPIVACAINE-EPINEPHRINE 0.25% -1:200000 IJ SOLN
INTRAMUSCULAR | Status: DC | PRN
  Administered 2022-12-25: 20 mL

## 2022-12-25 MED ORDER — POVIDONE-IODINE 10 % EX SWAB
2.0000 | Freq: Once | CUTANEOUS | Status: AC
  Administered 2022-12-25: 2 via TOPICAL

## 2022-12-25 MED ORDER — LIDOCAINE 2% (20 MG/ML) 5 ML SYRINGE
INTRAMUSCULAR | Status: AC
  Filled 2022-12-25: qty 5

## 2022-12-25 MED ORDER — CHLORHEXIDINE GLUCONATE 0.12 % MT SOLN
15.0000 mL | Freq: Once | OROMUCOSAL | Status: AC
  Administered 2022-12-25: 15 mL via OROMUCOSAL
  Filled 2022-12-25: qty 15

## 2022-12-25 MED ORDER — HYDROMORPHONE HCL 1 MG/ML IJ SOLN
INTRAMUSCULAR | Status: AC
  Filled 2022-12-25: qty 0.5

## 2022-12-25 MED ORDER — KETOROLAC TROMETHAMINE 30 MG/ML IJ SOLN
INTRAMUSCULAR | Status: AC
  Filled 2022-12-25: qty 1

## 2022-12-25 MED ORDER — ORAL CARE MOUTH RINSE: 15.0000 mL | Freq: Once | OROMUCOSAL | Status: AC

## 2022-12-25 MED ORDER — DEXAMETHASONE SODIUM PHOSPHATE 10 MG/ML IJ SOLN
INTRAMUSCULAR | Status: AC
  Filled 2022-12-25: qty 1

## 2022-12-25 MED ORDER — DOXYCYCLINE HYCLATE 100 MG PO TABS
200.0000 mg | ORAL_TABLET | Freq: Once | ORAL | 0 refills | Status: AC
  Filled 2022-12-25: qty 2, 1d supply, fill #0

## 2022-12-25 MED ORDER — ONDANSETRON HCL 4 MG/2ML IJ SOLN
INTRAMUSCULAR | Status: DC | PRN
Start: 2022-12-25 — End: 2022-12-25
  Administered 2022-12-25: 4 mg via INTRAVENOUS

## 2022-12-25 MED ORDER — LACTATED RINGERS IV SOLN: INTRAVENOUS | Status: DC

## 2022-12-25 MED ORDER — MIDAZOLAM HCL 2 MG/2ML IJ SOLN
INTRAMUSCULAR | Status: DC | PRN
  Administered 2022-12-25: 2 mg via INTRAVENOUS

## 2022-12-25 MED ORDER — HYDROMORPHONE HCL 1 MG/ML IJ SOLN
INTRAMUSCULAR | Status: DC | PRN
  Administered 2022-12-25: .5 mg via INTRAVENOUS

## 2022-12-25 MED ORDER — MIDAZOLAM HCL 2 MG/2ML IJ SOLN
INTRAMUSCULAR | Status: AC
  Filled 2022-12-25: qty 2

## 2022-12-25 MED ORDER — ACETAMINOPHEN 10 MG/ML IV SOLN
INTRAVENOUS | Status: AC
  Filled 2022-12-25: qty 100

## 2022-12-25 MED ORDER — OXYCODONE HCL 5 MG PO TABS
5.0000 mg | ORAL_TABLET | ORAL | 0 refills | Status: AC | PRN
Start: 2022-12-25 — End: ?
  Filled 2022-12-25: qty 15, 3d supply, fill #0

## 2022-12-25 MED ORDER — LIDOCAINE 2% (20 MG/ML) 5 ML SYRINGE
INTRAMUSCULAR | Status: DC | PRN
  Administered 2022-12-25: 100 mg via INTRAVENOUS

## 2022-12-25 MED ORDER — PROPOFOL 500 MG/50ML IV EMUL
INTRAVENOUS | Status: DC | PRN
  Administered 2022-12-25: 150 ug/kg/min via INTRAVENOUS

## 2022-12-25 MED ORDER — DOXYCYCLINE HYCLATE 100 MG PO TABS
100.0000 mg | ORAL_TABLET | Freq: Once | ORAL | Status: AC
  Administered 2022-12-25: 100 mg via ORAL
  Filled 2022-12-25 (×2): qty 1

## 2022-12-25 MED ORDER — ACETAMINOPHEN 10 MG/ML IV SOLN
1000.0000 mg | Freq: Once | INTRAVENOUS | Status: DC | PRN
  Administered 2022-12-25: 1000 mg via INTRAVENOUS

## 2022-12-25 MED ORDER — DOXYCYCLINE HYCLATE 100 MG PO TABS: 200.0000 mg | ORAL_TABLET | Freq: Once | ORAL | Status: DC

## 2022-12-25 MED ORDER — DEXAMETHASONE SODIUM PHOSPHATE 10 MG/ML IJ SOLN
INTRAMUSCULAR | Status: DC | PRN
  Administered 2022-12-25: 10 mg via INTRAVENOUS

## 2022-12-25 MED ORDER — ONDANSETRON HCL 4 MG/2ML IJ SOLN
INTRAMUSCULAR | Status: AC
  Filled 2022-12-25: qty 2

## 2022-12-25 MED ORDER — PROPOFOL 10 MG/ML IV BOLUS
INTRAVENOUS | Status: DC | PRN
  Administered 2022-12-25: 200 mg via INTRAVENOUS

## 2022-12-25 MED ORDER — FENTANYL CITRATE (PF) 100 MCG/2ML IJ SOLN: 25.0000 ug | INTRAMUSCULAR | Status: DC | PRN

## 2022-12-25 MED ORDER — ONDANSETRON HCL 4 MG/2ML IJ SOLN: 4.0000 mg | Freq: Once | INTRAMUSCULAR | Status: DC | PRN

## 2022-12-25 MED ORDER — BUPIVACAINE-EPINEPHRINE (PF) 0.25% -1:200000 IJ SOLN
INTRAMUSCULAR | Status: AC
  Filled 2022-12-25: qty 30

## 2022-12-25 MED ORDER — KETOROLAC TROMETHAMINE 30 MG/ML IJ SOLN
INTRAMUSCULAR | Status: DC | PRN
Start: 2022-12-25 — End: 2022-12-25
  Administered 2022-12-25: 30 mg via INTRAVENOUS

## 2022-12-25 SURGICAL SUPPLY — 20 items
CATH ROBINSON RED A/P 16FR (CATHETERS) ×1 IMPLANT
FILTER UTR ASPR ASSEMBLY (MISCELLANEOUS) ×1 IMPLANT
GLOVE BIO SURGEON STRL SZ 6.5 (GLOVE) ×1 IMPLANT
GLOVE BIOGEL PI IND STRL 7.0 (GLOVE) ×2 IMPLANT
GOWN STRL REUS W/ TWL LRG LVL3 (GOWN DISPOSABLE) ×2 IMPLANT
GOWN STRL REUS W/TWL LRG LVL3 (GOWN DISPOSABLE) ×2
HOSE CONNECTING 18IN BERKELEY (TUBING) ×1 IMPLANT
KIT BERKELEY 1ST TRI 3/8 NO TR (MISCELLANEOUS) ×1 IMPLANT
KIT BERKELEY 1ST TRIMESTER 3/8 (MISCELLANEOUS) ×1 IMPLANT
NS IRRIG 1000ML POUR BTL (IV SOLUTION) ×1 IMPLANT
PACK VAGINAL MINOR WOMEN LF (CUSTOM PROCEDURE TRAY) ×1 IMPLANT
PAD OB MATERNITY 4.3X12.25 (PERSONAL CARE ITEMS) ×1 IMPLANT
SET BERKELEY SUCTION TUBING (SUCTIONS) ×1 IMPLANT
SPIKE FLUID TRANSFER (MISCELLANEOUS) ×1 IMPLANT
TOWEL GREEN STERILE FF (TOWEL DISPOSABLE) ×2 IMPLANT
UNDERPAD 30X36 HEAVY ABSORB (UNDERPADS AND DIAPERS) ×1 IMPLANT
VACURETTE 10 RIGID CVD (CANNULA) IMPLANT
VACURETTE 7MM CVD STRL WRAP (CANNULA) IMPLANT
VACURETTE 8 RIGID CVD (CANNULA) IMPLANT
VACURETTE 9 RIGID CVD (CANNULA) IMPLANT

## 2022-12-25 NOTE — Discharge Instructions (Signed)
Heather Cole, 1. Nothing in vagina for 2 weeks and until cleared by a provider at the office, no sexual intercourse, no tampons usage for the next two weeks.    2. Expect some vaginal bleeding for next several days, call me if with excessive bleeding requiring you to change a pad every hour or less.  3.  Expect some abdominal cramping, take pain medication (Tylenol, ibuprofen, oxycodone) as needed.  Call me if pain is intolerable despite medication use. 4. Please take oral antibiotic doxycycline as prescribed, 2 tabs tonight to complete the antibiotic course.  I wish you a quick recovery.   Dr. Hoover Browns. Office: [phone] [phone] during regular business hours.  If after business hours press option to speak to the provider on call.

## 2022-12-25 NOTE — Anesthesia Procedure Notes (Signed)
Procedure Name: LMA Insertion Date/Time: 12/25/2022 10:37 AM  Performed by: Earlene Plater, CRNAPre-anesthesia Checklist: Patient identified, Emergency Drugs available, Suction available, Patient being monitored and Timeout performed Patient Re-evaluated:Patient Re-evaluated prior to induction Oxygen Delivery Method: Circle system utilized Preoxygenation: Pre-oxygenation with 100% oxygen Induction Type: IV induction Ventilation: Mask ventilation without difficulty LMA: LMA inserted LMA Size: 4.0 Number of attempts: 1 Placement Confirmation: ETT inserted through vocal cords under direct vision, positive ETCO2, CO2 detector and breath sounds checked- equal and bilateral Tube secured with: Tape (Confirmed by Dr. Okey Dupre) Dental Injury: Teeth and Oropharynx as per pre-operative assessment

## 2022-12-25 NOTE — Transfer of Care (Signed)
Immediate Anesthesia Transfer of Care Note  Patient: Heather Cole  Procedure(s) Performed: DILATATION AND EVACUATION (Vagina ) OPERATIVE ULTRASOUND (Vagina )  Patient Location: PACU  Anesthesia Type:General  Level of Consciousness: awake, alert , oriented, patient cooperative, and responds to stimulation  Airway & Oxygen Therapy: Patient Spontanous Breathing and Patient connected to face mask oxygen  Post-op Assessment: Report given to RN and Post -op Vital signs reviewed and stable  Post vital signs: Reviewed and stable  Last Vitals:  Vitals Value Taken Time  BP 104/88 12/25/22 1116  Temp 36.6 C 12/25/22 1115  Pulse 80 12/25/22 1122  Resp 16 12/25/22 1122  SpO2 94 % 12/25/22 1122  Vitals shown include unfiled device data.      Complications: No notable events documented.

## 2022-12-25 NOTE — Interval H&P Note (Signed)
History and Physical Interval Note:  12/25/2022 9:36 AM  Heather Cole  has presented today for surgery, with the diagnosis of missed abortion.  The various methods of treatment have been discussed with the patient and family. After consideration of risks, benefits and other options for treatment, the patient has consented to  DILATATION AND EVACUATION (N/A) - OPERATIVE ULTRASOUND (N/A) as a surgical intervention.  The patient's history has been reviewed, patient examined, no change in status, stable for surgery.  I have reviewed the patient's chart and labs.  Questions were answered to the patient's satisfaction.    Prescilla Sours, MD.

## 2022-12-25 NOTE — Anesthesia Preprocedure Evaluation (Signed)
Anesthesia Evaluation  Patient identified by MRN, date of birth, ID band Patient awake    Reviewed: Allergy & Precautions, H&P , NPO status , Patient's Chart, lab work & pertinent test results  Airway Mallampati: II  TM Distance: >3 FB Neck ROM: Full    Dental no notable dental hx.    Pulmonary neg pulmonary ROS   Pulmonary exam normal breath sounds clear to auscultation       Cardiovascular negative cardio ROS Normal cardiovascular exam Rhythm:Regular Rate:Normal     Neuro/Psych negative neurological ROS  negative psych ROS   GI/Hepatic negative GI ROS, Neg liver ROS,,,  Endo/Other  negative endocrine ROS    Renal/GU negative Renal ROS  negative genitourinary   Musculoskeletal negative musculoskeletal ROS (+)    Abdominal   Peds negative pediatric ROS (+)  Hematology  (+) Blood dyscrasia, anemia   Anesthesia Other Findings   Reproductive/Obstetrics negative OB ROS                             Anesthesia Physical Anesthesia Plan  ASA: 2  Anesthesia Plan: General   Post-op Pain Management: Minimal or no pain anticipated   Induction: Intravenous  PONV Risk Score and Plan: 3 and Ondansetron, Dexamethasone and Treatment may vary due to age or medical condition  Airway Management Planned: LMA  Additional Equipment:   Intra-op Plan:   Post-operative Plan: Extubation in OR  Informed Consent: I have reviewed the patients History and Physical, chart, labs and discussed the procedure including the risks, benefits and alternatives for the proposed anesthesia with the patient or authorized representative who has indicated his/her understanding and acceptance.     Dental advisory given  Plan Discussed with: CRNA and Surgeon  Anesthesia Plan Comments:        Anesthesia Quick Evaluation

## 2022-12-25 NOTE — Anesthesia Postprocedure Evaluation (Signed)
Anesthesia Post Note  Patient: Heather Cole  Procedure(s) Performed: DILATATION AND EVACUATION (Vagina ) OPERATIVE ULTRASOUND (Vagina )     Patient location during evaluation: PACU Anesthesia Type: General Level of consciousness: awake and alert Pain management: pain level controlled Vital Signs Assessment: post-procedure vital signs reviewed and stable Respiratory status: spontaneous breathing, nonlabored ventilation, respiratory function stable and patient connected to nasal cannula oxygen Cardiovascular status: blood pressure returned to baseline and stable Postop Assessment: no apparent nausea or vomiting Anesthetic complications: no  No notable events documented.  Last Vitals:  Vitals:   12/25/22 1130 12/25/22 1145  BP: (!) 101/55 108/64  Pulse: 74 67  Resp: 17 17  Temp:  36.6 C  SpO2: 95% 95%    Last Pain:  Vitals:   12/25/22 0829  TempSrc: Oral                 [name] S

## 2022-12-25 NOTE — Op Note (Addendum)
Patient: Heather Cole MRN: [id number] Date of birth: 1975/05/20 Date of procedure: 12/25/22.  Preop diagnosis: Missed abortion at 8 weeks 1 day EGA.   Postop diagnosis:  Same as above.    Procedure:  1. Suction dilation and curettage with intra-operative ultrasound guidance.    Surgeon: Dr. Hoover Browns.   Ultrasound: Done by ultrasound technician. I provided interpretation of ultrasound images.      Anesthesia: General LMA.   Complications: None.  Input: 250 cc LR.  Output: EBL: 100cc including products of conception.    Urine: 200 cc straight catheterization.   Findings: Uterus 8 week sized. No palpable adnexal masses bilaterally. Cervix closed with small blood coming through the os.  Indications: 47 year-old G3P0 with a missed abortion at 8 weeks 1 days EGA here for a suction dilation and curettage.  A recent pelvic ultrasound showed a fetal pole without a fetal heart beat at 8 weeks 1 days EGA.  She was consented for the procedure after explaining the risks, benefits and alternatives of the procedure including risks of bleeding, infection, damage to organs and uterine scarring.  She was a known blood group A positive.      Procedure: She was taken to the operating room anesthesia was administered without difficulty. 100 mg oral doxycyline was given preoperatively.  She was placed in the dorsal lithotomy position and prepped with betadine and draped in the usual sterile fashion.   Straight catheterization was performed.  Graves speculum was placed in and cervix grasped with tenaculum anteriorly.  Marcaine with epinephrine was given as a paracervical block, total 20 cc.  Cervix was dilated to the # 21 pratt.  # 8 suction curet was  advanced through cervix to fundus and electric suction initiated to green zone, 600 mm Hg.  This was done with ultrasound guidance. Initially the ultrasound showed intrauterine pregnancy with fetal pole but no heart beat.  Total three suction passes were  made with evacuation of products of conception and subsequent thinning of the endometrial lining on ultrasound.  Gentle sharp currettage was then done and uterine walls noted to be gritty. Suction was done one more time.  Excellent hemostasis was noted.  The instruments were then removed and the patient was then awoken from anesthesia, she was cleaned and taken to recovery room in stable condition.  Specimen: Products of conception.   Dr. Hoover Browns, MD.   12/25/22.

## 2022-12-26 ENCOUNTER — Encounter (HOSPITAL_COMMUNITY): Payer: Self-pay | Admitting: Obstetrics & Gynecology

## 2022-12-26 LAB — SURGICAL PATHOLOGY

## 2023-01-17 ENCOUNTER — Encounter: Payer: Self-pay | Admitting: Nurse Practitioner

## 2023-01-17 ENCOUNTER — Ambulatory Visit: Payer: 59 | Admitting: Nurse Practitioner

## 2023-01-17 VITALS — BP 130/78 | HR 75 | Temp 98.0°F | Ht 65.0 in | Wt 179.2 lb

## 2023-01-17 DIAGNOSIS — Z3041 Encounter for surveillance of contraceptive pills: Secondary | ICD-10-CM | POA: Diagnosis not present

## 2023-01-17 DIAGNOSIS — Z7689 Persons encountering health services in other specified circumstances: Secondary | ICD-10-CM | POA: Diagnosis not present

## 2023-01-17 DIAGNOSIS — D500 Iron deficiency anemia secondary to blood loss (chronic): Secondary | ICD-10-CM

## 2023-01-17 DIAGNOSIS — R030 Elevated blood-pressure reading, without diagnosis of hypertension: Secondary | ICD-10-CM

## 2023-01-17 DIAGNOSIS — Z23 Encounter for immunization: Secondary | ICD-10-CM | POA: Diagnosis not present

## 2023-01-17 NOTE — Progress Notes (Unsigned)
Heather Cole, CMA,acting as a Neurosurgeon for Heather Felts, FNP.,have documented all relevant documentation on the behalf of Heather Felts, FNP,as directed by  Heather Felts, FNP while in the presence of Heather Felts, FNP.  Subjective:  Patient ID: Heather Cole , female    DOB: [birth year] , 47 y.o.   MRN: [id number]  Chief Complaint  Patient presents with   Establish Care    HPI  Patient presents today to establish care, she was going to [place] to see Norberto Sorenson. Single. No children. Works as a Optometrist.   Patient reports compliance with medication. Patient denies any chest pain, SOB, or headaches. Patient would like to discuss her anemia. She reports anemia has been a problem from her whole life, she reports low b12 as well. Patient reports she had a D&C on 12/25/2022 and hasn't had a period since. Patient is established with a OBGYN - [place]. Horald Chestnut - nurse midwife. She has seen them for a follow up one week after return in 6 months. She has a history of migraine - had side effects from amitryptiline. Infrequent - weather, red wines only has tried sumitriptan which work regularly. She had been vegetarian for several years but is now eating meat. MCV has been 106. Vitamin B12 injection once and will occasionally take a B12 sublingual. She was having pulsatile tinnitis, thyroid was normal and her Hgb was 8.5. non smoker. Her birth control is to help with menstrual cycles. She had Covid in August. She has not had a cycle   Mother - hypertension (mother is adopted) Father - atrial flutter - had ablation and likely hypertension Brother - healthy.  Paternal aunt - breast cancer (2 of them), one aunt with triple negative and passed away.      Past Medical History:  Diagnosis Date   History of spontaneous abortion    (2) 7 week Miscarriages last 04-2018   IDA (iron deficiency anemia)    Migraines    Ovarian cyst    hx - bilateral     Family History  Problem Relation Age of  Onset   Hypertension Mother    Heart disease Father    Breast cancer Paternal Aunt    Lymphoma Paternal Grandmother      Current Outpatient Medications:    desogestrel-ethinyl estradiol (CYRED EQ) 0.15-30 MG-MCG tablet, Take 1 tablet by mouth daily., Disp: 28 tablet, Rfl: 2   EPINEPHrine (EPIPEN 2-PAK) 0.3 mg/0.3 mL IJ SOAJ injection, Inject 0.3 mg into the muscle as needed for anaphylaxis., Disp: , Rfl:    ibuprofen (ADVIL) 600 MG tablet, Take 1 tablet (600 mg total) by mouth every 6 (six) hours as needed., Disp: 30 tablet, Rfl: 0   oxyCODONE (ROXICODONE) 5 MG immediate release tablet, Take 1 tablet (5 mg total) by mouth every 4 (four) hours as needed for severe pain or breakthrough pain., Disp: 15 tablet, Rfl: 0   SUMAtriptan (IMITREX) 50 MG tablet, Take 1 tablet by mouth as needed for migraine, may repeat in 2 hours if needed, Disp: 10 tablet, Rfl: 2   Allergies  Allergen Reactions   Bee Venom Anaphylaxis   Sulfa Antibiotics Itching     Review of Systems  Constitutional: Negative.  Negative for activity change and fatigue.  Eyes:  Negative for visual disturbance.  Respiratory: Negative.  Negative for choking, shortness of breath and wheezing.   Cardiovascular: Negative.  Negative for chest pain, palpitations and leg swelling.  Gastrointestinal: Negative.   Endocrine: Negative.  Negative  for polydipsia, polyphagia and polyuria.  Musculoskeletal: Negative.   Skin: Negative.   Neurological:  Negative for dizziness, weakness and headaches.  Psychiatric/Behavioral:  Negative for confusion. The patient is not nervous/anxious.      Today's Vitals   01/17/23 1531 01/17/23 1543  BP: (!) 134/90 130/78  Pulse: 75   Temp: 98 F (36.7 C)   TempSrc: Oral   Weight: 179 lb 3.2 oz (81.3 kg)   Height: 5\' 5"  (1.651 m)   PainSc: 0-No pain    Body mass index is 29.82 kg/m.  Wt Readings from Last 3 Encounters:  01/17/23 179 lb 3.2 oz (81.3 kg)  12/25/22 175 lb (79.4 kg)  06/28/20 160  lb (72.6 kg)      Objective:  Physical Exam Vitals reviewed.  Constitutional:      General: She is not in acute distress.    Appearance: Normal appearance. She is well-developed. She is obese.  Neck:     Thyroid: No thyroid mass, thyromegaly or thyroid tenderness.  Cardiovascular:     Rate and Rhythm: Normal rate and regular rhythm.     Pulses: Normal pulses.     Heart sounds: Normal heart sounds. No murmur heard. Pulmonary:     Effort: Pulmonary effort is normal. No respiratory distress.     Breath sounds: Normal breath sounds.  Chest:     Chest wall: No tenderness.  Musculoskeletal:        General: Normal range of motion.     Cervical back: Normal range of motion and neck supple. No tenderness.  Skin:    General: Skin is warm and dry.     Capillary Refill: Capillary refill takes less than 2 seconds.  Neurological:     General: No focal deficit present.     Mental Status: She is alert and oriented to person, place, and time.     Cranial Nerves: No cranial nerve deficit.  Psychiatric:        Mood and Affect: Mood normal.        Behavior: Behavior normal.        Thought Content: Thought content normal.        Judgment: Judgment normal.         Assessment And Plan:  Establishing care with new doctor, encounter for Assessment & Plan: Patient is here to establish care. Went over patient medical, family, social and surgical history. Reviewed with patient their medications and any allergies  Reviewed with patient their sexual orientation, drug/tobacco and alcohol use Dicussed any new concerns with patient  recommended patient comes in for a physical exam and complete blood work.  Educated patient about the importance of annual screenings and immunizations.  Advised patient to eat a healthy diet along with exercise for atleast 30-45 min atleast 4-5 days of the week.     Iron deficiency anemia due to chronic blood loss Assessment & Plan: Has been stable, will check  early part of next year.    Need for influenza vaccination Assessment & Plan: Influenza vaccine administered Encouraged to take Tylenol as needed for fever or muscle aches.   Orders: -     Flu vaccine trivalent PF, 6mos and older(Flulaval,Afluria,Fluarix,Fluzone)  Surveillance for birth control, oral contraceptives Assessment & Plan: Refill for birth control sent. She is aware to start after her menstrual cycle starts. Negative urine pregnancy  Orders: -     POCT urine pregnancy  Elevated blood pressure reading in office without diagnosis of hypertension Assessment & Plan: This  was her first visit, slightly elevated repeat was improved. Encouraged to focus on low salt diet and regular activity   Other orders -     Cyred EQ; Take 1 tablet by mouth daily.  Dispense: 28 tablet; Refill: 2 -     SUMAtriptan Succinate; Take 1 tablet by mouth as needed for migraine, may repeat in 2 hours if needed  Dispense: 10 tablet; Refill: 2    Return for HM in March .  Patient was given opportunity to ask questions. Patient verbalized understanding of the plan and was able to repeat key elements of the plan. All questions were answered to their satisfaction.    Jeanell Sparrow, FNP, have reviewed all documentation for this visit. The documentation on 01/17/23 for the exam, diagnosis, procedures, and orders are all accurate and complete.   IF YOU HAVE BEEN REFERRED TO A SPECIALIST, IT MAY TAKE 1-2 WEEKS TO SCHEDULE/PROCESS THE REFERRAL. IF YOU HAVE NOT HEARD FROM US/SPECIALIST IN TWO WEEKS, PLEASE GIVE [place] A CALL AT [phone] X [phone].

## 2023-01-23 DIAGNOSIS — Z23 Encounter for immunization: Secondary | ICD-10-CM | POA: Insufficient documentation

## 2023-01-23 DIAGNOSIS — R030 Elevated blood-pressure reading, without diagnosis of hypertension: Secondary | ICD-10-CM | POA: Insufficient documentation

## 2023-01-23 DIAGNOSIS — Z7689 Persons encountering health services in other specified circumstances: Secondary | ICD-10-CM | POA: Insufficient documentation

## 2023-01-23 DIAGNOSIS — Z3041 Encounter for surveillance of contraceptive pills: Secondary | ICD-10-CM | POA: Insufficient documentation

## 2023-01-23 MED ORDER — CYRED EQ 0.15-30 MG-MCG PO TABS
1.0000 | ORAL_TABLET | Freq: Every day | ORAL | 2 refills | Status: AC
  Filled 2023-01-23: qty 28, 28d supply, fill #0
  Filled 2023-07-15 – 2023-07-27 (×2): qty 28, 28d supply, fill #1
  Filled 2023-08-21 – 2023-09-04 (×2): qty 28, 28d supply, fill #2

## 2023-01-23 MED ORDER — SUMATRIPTAN SUCCINATE 50 MG PO TABS
ORAL_TABLET | ORAL | 2 refills | Status: AC
  Filled 2023-01-23: qty 10, 30d supply, fill #0
  Filled 2023-07-15 – 2023-07-27 (×2): qty 10, 30d supply, fill #1

## 2023-01-23 NOTE — Assessment & Plan Note (Signed)
Influenza vaccine administered Encouraged to take Tylenol as needed for fever or muscle aches.

## 2023-01-23 NOTE — Assessment & Plan Note (Addendum)
Refill for birth control sent. She is aware to start after her menstrual cycle starts. Negative urine pregnancy

## 2023-01-23 NOTE — Assessment & Plan Note (Signed)
Has been stable, will check early part of next year.

## 2023-01-23 NOTE — Assessment & Plan Note (Signed)
This was her first visit, slightly elevated repeat was improved. Encouraged to focus on low salt diet and regular activity

## 2023-01-23 NOTE — Assessment & Plan Note (Signed)

## 2023-01-24 ENCOUNTER — Other Ambulatory Visit (HOSPITAL_COMMUNITY): Payer: Self-pay

## 2023-01-24 LAB — POCT URINE PREGNANCY: Preg Test, Ur: NEGATIVE

## 2023-01-27 ENCOUNTER — Other Ambulatory Visit (HOSPITAL_COMMUNITY): Payer: Self-pay

## 2023-06-13 ENCOUNTER — Encounter: Payer: 59 | Admitting: Nurse Practitioner

## 2023-07-25 ENCOUNTER — Other Ambulatory Visit (HOSPITAL_COMMUNITY): Payer: Self-pay

## 2023-07-25 ENCOUNTER — Encounter: Admitting: Nurse Practitioner

## 2023-07-27 ENCOUNTER — Other Ambulatory Visit (HOSPITAL_COMMUNITY): Payer: Self-pay

## 2023-08-29 ENCOUNTER — Encounter: Admitting: Nurse Practitioner

## 2023-08-31 ENCOUNTER — Other Ambulatory Visit (HOSPITAL_COMMUNITY): Payer: Self-pay

## 2023-09-04 ENCOUNTER — Other Ambulatory Visit (HOSPITAL_COMMUNITY): Payer: Self-pay

## 2023-09-04 MED ORDER — TRIAMCINOLONE ACETONIDE 0.5 % EX OINT
TOPICAL_OINTMENT | CUTANEOUS | 5 refills | Status: AC
  Filled 2023-09-04: qty 30, 14d supply, fill #0

## 2023-10-17 DIAGNOSIS — H5213 Myopia, bilateral: Secondary | ICD-10-CM | POA: Diagnosis not present

## 2024-01-21 ENCOUNTER — Other Ambulatory Visit (HOSPITAL_COMMUNITY): Payer: Self-pay

## 2024-01-21 DIAGNOSIS — Z1329 Encounter for screening for other suspected endocrine disorder: Secondary | ICD-10-CM | POA: Diagnosis not present

## 2024-01-21 DIAGNOSIS — N96 Recurrent pregnancy loss: Secondary | ICD-10-CM | POA: Diagnosis not present

## 2024-01-21 DIAGNOSIS — Z7689 Persons encountering health services in other specified circumstances: Secondary | ICD-10-CM | POA: Diagnosis not present

## 2024-01-21 DIAGNOSIS — G43009 Migraine without aura, not intractable, without status migrainosus: Secondary | ICD-10-CM | POA: Diagnosis not present

## 2024-01-21 DIAGNOSIS — Z1321 Encounter for screening for nutritional disorder: Secondary | ICD-10-CM | POA: Diagnosis not present

## 2024-01-21 DIAGNOSIS — D539 Nutritional anemia, unspecified: Secondary | ICD-10-CM | POA: Diagnosis not present

## 2024-01-21 DIAGNOSIS — O09521 Supervision of elderly multigravida, first trimester: Secondary | ICD-10-CM | POA: Diagnosis not present

## 2024-01-21 DIAGNOSIS — Z13220 Encounter for screening for lipoid disorders: Secondary | ICD-10-CM | POA: Diagnosis not present

## 2024-01-21 DIAGNOSIS — Z23 Encounter for immunization: Secondary | ICD-10-CM | POA: Diagnosis not present

## 2024-01-21 DIAGNOSIS — D7589 Other specified diseases of blood and blood-forming organs: Secondary | ICD-10-CM | POA: Diagnosis not present

## 2024-01-21 MED ORDER — RIZATRIPTAN BENZOATE 5 MG PO TBDP
ORAL_TABLET | ORAL | 0 refills | Status: AC
  Filled 2024-01-21 – 2024-03-07 (×2): qty 27, 30d supply, fill #0

## 2024-01-23 DIAGNOSIS — D7589 Other specified diseases of blood and blood-forming organs: Secondary | ICD-10-CM | POA: Diagnosis not present

## 2024-02-03 ENCOUNTER — Other Ambulatory Visit (HOSPITAL_COMMUNITY): Payer: Self-pay

## 2024-03-03 DIAGNOSIS — O30009 Twin pregnancy, unspecified number of placenta and unspecified number of amniotic sacs, unspecified trimester: Secondary | ICD-10-CM | POA: Diagnosis not present

## 2024-03-03 DIAGNOSIS — Z3481 Encounter for supervision of other normal pregnancy, first trimester: Secondary | ICD-10-CM | POA: Diagnosis not present

## 2024-03-06 ENCOUNTER — Other Ambulatory Visit (HOSPITAL_COMMUNITY): Payer: Self-pay

## 2024-03-06 LAB — LAB REPORT - SCANNED
A1c: 5.3
HM HIV Screening: NEGATIVE
HM Hepatitis Screen: NEGATIVE

## 2024-03-06 MED ORDER — D2000 ULTRA STRENGTH 50 MCG (2000 UT) PO CAPS
ORAL_CAPSULE | ORAL | 6 refills | Status: AC
  Filled 2024-03-06: qty 60, 30d supply, fill #0
  Filled 2024-04-01: qty 60, 30d supply, fill #1

## 2024-03-07 ENCOUNTER — Other Ambulatory Visit (HOSPITAL_COMMUNITY): Payer: Self-pay

## 2024-03-11 ENCOUNTER — Other Ambulatory Visit: Payer: Self-pay

## 2024-03-11 DIAGNOSIS — O09511 Supervision of elderly primigravida, first trimester: Secondary | ICD-10-CM

## 2024-03-17 DIAGNOSIS — Z124 Encounter for screening for malignant neoplasm of cervix: Secondary | ICD-10-CM | POA: Diagnosis not present

## 2024-04-01 ENCOUNTER — Other Ambulatory Visit (HOSPITAL_COMMUNITY): Payer: Self-pay

## 2024-04-01 MED ORDER — HEPARIN SODIUM (PORCINE) 5000 UNIT/ML IJ SOLN
5000.0000 [IU] | Freq: Every day | INTRAMUSCULAR | 1 refills | Status: AC
  Filled 2024-04-01 – 2024-04-02 (×2): qty 30, 30d supply, fill #0
  Filled 2024-04-27: qty 30, 30d supply, fill #1

## 2024-04-02 ENCOUNTER — Other Ambulatory Visit (HOSPITAL_COMMUNITY): Payer: Self-pay

## 2024-04-02 ENCOUNTER — Other Ambulatory Visit: Payer: Self-pay

## 2024-04-12 ENCOUNTER — Other Ambulatory Visit (HOSPITAL_COMMUNITY): Payer: Self-pay

## 2024-04-16 ENCOUNTER — Encounter: Payer: Self-pay | Admitting: *Deleted

## 2024-04-16 DIAGNOSIS — Z8742 Personal history of other diseases of the female genital tract: Secondary | ICD-10-CM | POA: Insufficient documentation

## 2024-04-16 DIAGNOSIS — O30009 Twin pregnancy, unspecified number of placenta and unspecified number of amniotic sacs, unspecified trimester: Secondary | ICD-10-CM | POA: Insufficient documentation

## 2024-04-28 ENCOUNTER — Ambulatory Visit

## 2024-04-30 ENCOUNTER — Ambulatory Visit

## 2024-04-30 ENCOUNTER — Ambulatory Visit: Admitting: Obstetrics

## 2024-04-30 ENCOUNTER — Other Ambulatory Visit (HOSPITAL_COMMUNITY): Payer: Self-pay

## 2024-04-30 VITALS — BP 121/74 | HR 86

## 2024-04-30 DIAGNOSIS — O30049 Twin pregnancy, dichorionic/diamniotic, unspecified trimester: Secondary | ICD-10-CM

## 2024-04-30 DIAGNOSIS — O09522 Supervision of elderly multigravida, second trimester: Secondary | ICD-10-CM | POA: Diagnosis not present

## 2024-04-30 DIAGNOSIS — O30042 Twin pregnancy, dichorionic/diamniotic, second trimester: Secondary | ICD-10-CM | POA: Diagnosis not present

## 2024-04-30 DIAGNOSIS — Z3A21 21 weeks gestation of pregnancy: Secondary | ICD-10-CM | POA: Diagnosis not present

## 2024-04-30 DIAGNOSIS — E669 Obesity, unspecified: Secondary | ICD-10-CM

## 2024-04-30 DIAGNOSIS — O99212 Obesity complicating pregnancy, second trimester: Secondary | ICD-10-CM | POA: Diagnosis not present

## 2024-04-30 DIAGNOSIS — Z8742 Personal history of other diseases of the female genital tract: Secondary | ICD-10-CM

## 2024-04-30 DIAGNOSIS — O09812 Supervision of pregnancy resulting from assisted reproductive technology, second trimester: Secondary | ICD-10-CM

## 2024-04-30 DIAGNOSIS — O132 Gestational [pregnancy-induced] hypertension without significant proteinuria, second trimester: Secondary | ICD-10-CM | POA: Diagnosis not present

## 2024-04-30 DIAGNOSIS — O09511 Supervision of elderly primigravida, first trimester: Secondary | ICD-10-CM

## 2024-04-30 NOTE — Progress Notes (Signed)
 MFM Consult Note  Heather Cole is currently at [redacted]w[redacted]d. She was seen today for a detailed fetal anatomy scan due to an IVF twin pregnancy and advanced maternal age (49 years old).    She reports that 3 frozen embryos were placed.  The embryos were conceived using donor eggs from a person in their mid 48s.  She also has a history of multiple miscarriages.  She is currently taking a daily baby aspirin and prophylactic heparin  5000 units daily to decrease her risk of another miscarriage.  Due to her recurrent miscarriages, she reports that she was worked up for thrombophilias and the antiphospholipid antibody syndrome which were all negative.  She denies any personal history of thrombosis.  She denies any problems in her current pregnancy.    Due to the IVF pregnancy using donor eggs, she was unable to have a cell free DNA test drawn to screen for fetal aneuploidy.  Sonographic findings A thick dividing membrane along with two placentas were noted, indicating that these are dichorionic, diamniotic twins. Presentation: A: Variable, B: Transverse, head to maternal left. Fetal cardiac activity:  A: Observed, B: Observed and appears normal. The views of the fetal anatomy for both twin A and twin B were limited today due to the fetal positions.  What was visualized today appeared within normal limits. The fetal biometry measurements obtained today are consistent with an [facility] of September 07, 2024.   There was normal amniotic fluid noted around both fetuses with an MVP of 4.7 cm in twin A and 3.49 cm in twin B. Placenta: A: Anterior, B: Anterior.     Adnexa: No abnormality visualized. Cervical length: 3.5 cm.  The patient was informed that anomalies may be missed due to technical limitations. If the fetus is in a suboptimal position or maternal habitus is increased, visualization of the fetus in the maternal uterus may be impaired.  Dichorionic, Diamniotic Twins   She was advised that management of twin  pregnancies will involve frequent ultrasound exams to assess the fetal growth and amniotic fluid level.   We will continue to follow her with monthly growth ultrasounds.   Due to very advanced maternal age and the dichorionic twin pregnancy, weekly fetal testing should be started at 32 weeks and continued until delivery.    Delivery for uncomplicated dichorionic twins should occur at around 38 weeks.  The increased risk of preeclampsia, gestational diabetes, and preterm birth/labor associated with twin pregnancies was discussed.   As pregnancies with multiple gestations are at increased risk for developing preeclampsia, she was advised to continue taking a daily baby aspirin (81 mg per day) to decrease her risk of developing preeclampsia.  Advanced maternal age and IVF pregnancy  As this is a twin pregnancy conceived using donor eggs, she was not eligible to have a cell free DNA test drawn.  As the age of the donor was in her mid 62s, the fetus' baseline risk of Down syndrome should be low.  She understands that an amniocentesis is available for definitive diagnosis of fetal aneuploidy.    Due to her history of recurrent miscarriages, she was advised that it is reasonable for her to continue prophylactic heparin  for the duration of her pregnancy.   Due to the IVF pregnancy, she was referred to [facility] pediatric cardiology for a fetal echocardiogram.  A follow-up exam was scheduled in 4 weeks to assess the fetal growth and to complete the views of the fetal anatomy.   The patient stated that all  of her questions were answered.   A total of 45 minutes was spent counseling, reviewing her chart, documenting and coordinating the care for this patient.  Greater than 50% of the time was spent in direct face-to-face contact.

## 2024-06-04 ENCOUNTER — Ambulatory Visit
